# Patient Record
Sex: Female | Born: 1995 | Race: White | Hispanic: No | Marital: Single | Smoking: Current every day smoker
Health system: Southern US, Community
[De-identification: ages and names within clinical notes are randomized; demographics above are authoritative.]

## PROBLEM LIST (undated history)

## (undated) DIAGNOSIS — R011 Cardiac murmur, unspecified: Secondary | ICD-10-CM

## (undated) DIAGNOSIS — Q240 Dextrocardia: Secondary | ICD-10-CM

## (undated) HISTORY — PX: UNILATERAL SALPINGECTOMY: SHX6160

## (undated) HISTORY — PX: ECTOPIC PREGNANCY SURGERY: SHX613

---
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
## 2018-04-17 ENCOUNTER — Emergency Department
Admission: EM | Admit: 2018-04-17 | Discharge: 2018-04-17 | Disposition: A | Discharge status: Home / Self Care | Payer: Self-pay | Attending: Emergency Medicine | Admitting: Emergency Medicine

## 2018-04-17 ENCOUNTER — Other Ambulatory Visit: Payer: Self-pay

## 2018-04-17 ENCOUNTER — Encounter: Payer: Self-pay | Admitting: Emergency Medicine

## 2018-04-17 DIAGNOSIS — J111 Influenza due to unidentified influenza virus with other respiratory manifestations: Secondary | ICD-10-CM | POA: Insufficient documentation

## 2018-04-17 DIAGNOSIS — F17210 Nicotine dependence, cigarettes, uncomplicated: Secondary | ICD-10-CM | POA: Insufficient documentation

## 2018-04-17 DIAGNOSIS — J029 Acute pharyngitis, unspecified: Secondary | ICD-10-CM

## 2018-04-17 DIAGNOSIS — R69 Illness, unspecified: Secondary | ICD-10-CM

## 2018-04-17 LAB — GROUP A STREP BY PCR: Group A Strep by PCR: NOT DETECTED

## 2018-04-17 MED ORDER — PSEUDOEPH-BROMPHEN-DM 30-2-10 MG/5ML PO SYRP: 5.0000 mL | ORAL_SOLUTION | Freq: Four times a day (QID) | ORAL | 0 refills | Status: DC | PRN

## 2018-04-17 MED ORDER — LIDOCAINE VISCOUS HCL 2 % MT SOLN: 10.0000 mL | OROMUCOSAL | 0 refills | Status: DC | PRN

## 2018-04-17 MED ORDER — LIDOCAINE VISCOUS HCL 2 % MT SOLN
15.0000 mL | Freq: Once | OROMUCOSAL | Status: AC
  Administered 2018-04-17: 15 mL via OROMUCOSAL
  Filled 2018-04-17: qty 15

## 2018-04-17 NOTE — ED Notes (Signed)
Pt verbalized understanding of discharge instructions. NAD at this time. 

## 2018-04-17 NOTE — ED Triage Notes (Signed)
C?O sore throat x 2 days.  Pain worse when swallowing.  Also c/o sinus congestion.

## 2018-04-17 NOTE — ED Provider Notes (Signed)
[facility] Emergency Department Provider Note  ____________________________________________  Time seen: Approximately 1:19 PM  I have reviewed the triage vital signs and the nursing notes.   HISTORY  Chief Complaint Sore Throat    HPI Madeline Medina is a 23 y.o. female that presents to the emergency department for evaluation of chills, nasal congestion, sore throat for 3 days.  Patient states that throat is painful with swallowing.  Coworkers have influenza.  No cough, shortness of breath, vomiting.   History reviewed. No pertinent past medical history.  There are no active problems to display for this patient.   Past Surgical History:  Procedure Laterality Date  . ECTOPIC PREGNANCY SURGERY      Prior to Admission medications   Medication Sig Start Date End Date Taking? Authorizing Provider  brompheniramine-pseudoephedrine-DM 30-2-10 MG/5ML syrup Take 5 mLs by mouth 4 (four) times daily as needed. 04/17/18   Enid Derry, PA-C  lidocaine (XYLOCAINE) 2 % solution Use as directed 10 mLs in the mouth or throat as needed. 04/17/18   Enid Derry, PA-C    Allergies Penicillins  No family history on file.  Social History Social History   Tobacco Use  . Smoking status: Current Every Day Smoker    Packs/day: 0.50    Types: Cigarettes  . Smokeless tobacco: Never Used  Substance Use Topics  . Alcohol use: No  . Drug use: No     Review of Systems  Constitutional: Positive for chills. Eyes: No visual changes. No discharge. ENT: Positive for congestion and rhinorrhea. Positive for sore throat. Cardiovascular: No chest pain. Respiratory: Negative for cough. No SOB. Gastrointestinal: No abdominal pain.  No nausea, no vomiting.  No diarrhea.  No constipation. Musculoskeletal: Negative for musculoskeletal pain. Skin: Negative for rash, abrasions, lacerations, ecchymosis. Neurological: Negative for  headaches.   ____________________________________________   PHYSICAL EXAM:  VITAL SIGNS: ED Triage Vitals  Enc Vitals Group     BP 04/17/18 1129 134/76     Pulse Rate 04/17/18 1129 73     Resp 04/17/18 1129 18     Temp 04/17/18 1129 98.2 F (36.8 C)     Temp Source 04/17/18 1129 Oral     SpO2 04/17/18 1129 98 %     Weight 04/17/18 1150 280 lb (127 kg)     Height 04/17/18 1150 5\' 10"  (1.778 m)     Head Circumference --      Peak Flow --      Pain Score 04/17/18 1149 8     Pain Loc --      Pain Edu? --      Excl. in GC? --      Constitutional: Alert and oriented. Well appearing and in no acute distress. Eyes: Conjunctivae are normal. PERRL. EOMI. No discharge. Head: Atraumatic. ENT: No frontal and maxillary sinus tenderness.      Ears: Tympanic membranes pearly gray with good landmarks. No discharge.      Nose: Mild congestion/rhinnorhea.      Mouth/Throat: Mucous membranes are moist. Oropharynx erythematous. Tonsils not enlarged. No exudates. Uvula midline. Neck: No stridor.   Hematological/Lymphatic/Immunilogical: No cervical lymphadenopathy. Cardiovascular: Normal rate, regular rhythm.  Good peripheral circulation. Respiratory: Normal respiratory effort without tachypnea or retractions. Lungs CTAB. Good air entry to the bases with no decreased or absent breath sounds. Gastrointestinal: Bowel sounds 4 quadrants. Soft and nontender to palpation. No guarding or rigidity. No palpable masses. No distention. Musculoskeletal: Medina range of motion to all extremities. No gross deformities  appreciated. Neurologic:  Normal speech and language. No gross focal neurologic deficits are appreciated.  Skin:  Skin is warm, dry and intact. No rash noted. Psychiatric: Mood and affect are normal. Speech and behavior are normal. Patient exhibits appropriate insight and judgement.   ____________________________________________   LABS (all labs ordered are listed, but only abnormal  results are displayed)  Labs Reviewed  GROUP A STREP BY PCR   ____________________________________________  EKG   ____________________________________________  RADIOLOGY  No results found.  ____________________________________________    PROCEDURES  Procedure(s) performed:    Procedures    Medications  lidocaine (XYLOCAINE) 2 % viscous mouth solution 15 mL (15 mLs Mouth/Throat Given 04/17/18 1341)     ____________________________________________   INITIAL IMPRESSION / ASSESSMENT AND PLAN / ED COURSE  Pertinent labs & imaging results that were available during my care of the patient were reviewed by me and considered in my medical decision making (see chart for details).  Review of the [place] CSRS was performed in accordance of the [facility] prior to dispensing any controlled drugs.   Patient's diagnosis is consistent with viral pharyngitis. Vital signs and exam are reassuring.  Strep throat test is negative. Patient has been exposed to influenza but is outside of the window to begin tamiflu. Patient appears well and is staying well hydrated. Patient should alternate tylenol and ibuprofen for fever. Patient feels comfortable going home. Patient will be discharged home with prescriptions for viscous lidocaine, Bromfed. Patient is to follow up with primary care as needed or otherwise directed. Patient is given ED precautions to return to the ED for any worsening or new symptoms    ____________________________________________  FINAL CLINICAL IMPRESSION(S) / ED DIAGNOSES  Final diagnoses:  Viral pharyngitis  Influenza-like illness      NEW MEDICATIONS STARTED DURING THIS VISIT:  ED Discharge Orders         Ordered    lidocaine (XYLOCAINE) 2 % solution  As needed     04/17/18 1327    brompheniramine-pseudoephedrine-DM 30-2-10 MG/5ML syrup  4 times daily PRN     04/17/18 1327              This chart was dictated using voice recognition software/Dragon.  Despite best efforts to proofread, errors can occur which can change the meaning. Any change was purely unintentional.    Enid Derry, PA-C 04/17/18 1520    Arnaldo Natal, MD 04/17/18 1721

## 2018-06-07 ENCOUNTER — Other Ambulatory Visit: Payer: Self-pay

## 2018-06-07 ENCOUNTER — Encounter: Payer: Self-pay | Admitting: Radiology

## 2018-06-07 ENCOUNTER — Emergency Department: Payer: Self-pay

## 2018-06-07 ENCOUNTER — Emergency Department
Admission: EM | Admit: 2018-06-07 | Discharge: 2018-06-07 | Disposition: A | Discharge status: Home / Self Care | Payer: Self-pay | Attending: Emergency Medicine | Admitting: Emergency Medicine

## 2018-06-07 DIAGNOSIS — F17210 Nicotine dependence, cigarettes, uncomplicated: Secondary | ICD-10-CM | POA: Insufficient documentation

## 2018-06-07 DIAGNOSIS — N3000 Acute cystitis without hematuria: Secondary | ICD-10-CM | POA: Insufficient documentation

## 2018-06-07 DIAGNOSIS — A749 Chlamydial infection, unspecified: Secondary | ICD-10-CM | POA: Insufficient documentation

## 2018-06-07 DIAGNOSIS — J039 Acute tonsillitis, unspecified: Secondary | ICD-10-CM | POA: Insufficient documentation

## 2018-06-07 DIAGNOSIS — J029 Acute pharyngitis, unspecified: Secondary | ICD-10-CM

## 2018-06-07 DIAGNOSIS — N760 Acute vaginitis: Secondary | ICD-10-CM | POA: Insufficient documentation

## 2018-06-07 DIAGNOSIS — B9689 Other specified bacterial agents as the cause of diseases classified elsewhere: Secondary | ICD-10-CM | POA: Insufficient documentation

## 2018-06-07 LAB — COMPREHENSIVE METABOLIC PANEL
ALT: 13 U/L (ref 0–44)
AST: 15 U/L (ref 15–41)
Albumin: 3.7 g/dL (ref 3.5–5.0)
Alkaline Phosphatase: 39 U/L (ref 38–126)
Anion gap: 10 (ref 5–15)
BUN: 9 mg/dL (ref 6–20)
CO2: 25 mmol/L (ref 22–32)
Calcium: 9 mg/dL (ref 8.9–10.3)
Chloride: 102 mmol/L (ref 98–111)
Creatinine, Ser: 0.64 mg/dL (ref 0.44–1.00)
GFR calc Af Amer: >60 mL/min (ref >60)
GFR calc non Af Amer: >60 mL/min (ref >60)
Glucose, Bld: 96 mg/dL (ref 70–99)
Potassium: 3.6 mmol/L (ref 3.5–5.1)
Sodium: 137 mmol/L (ref 135–145)
Total Bilirubin: 0.8 mg/dL (ref 0.3–1.2)
Total Protein: 8 g/dL (ref 6.5–8.1)

## 2018-06-07 LAB — URINALYSIS, ROUTINE W REFLEX MICROSCOPIC
Bilirubin Urine: NEGATIVE
Glucose, UA: NEGATIVE mg/dL
Ketones, ur: NEGATIVE mg/dL
Nitrite: POSITIVE — AB
Protein, ur: 100 mg/dL — AB
RBC / HPF: >50 RBC/hpf — ABNORMAL HIGH (ref 0–5)
Specific Gravity, Urine: 1.016 (ref 1.005–1.030)
WBC, UA: >50 WBC/hpf — ABNORMAL HIGH (ref 0–5)
pH: 7 (ref 5.0–8.0)

## 2018-06-07 LAB — WET PREP, GENITAL
Clue Cells Wet Prep HPF POC: POSITIVE — AB
Sperm: NONE SEEN
Trich, Wet Prep: NONE SEEN
Yeast Wet Prep HPF POC: NONE SEEN

## 2018-06-07 LAB — CBC WITH DIFFERENTIAL/PLATELET
Abs Immature Granulocytes: 0.03 10*3/uL (ref 0.00–0.07)
Basophils Absolute: 0 10*3/uL (ref 0.0–0.1)
Basophils Relative: 0 %
Eosinophils Absolute: 0.1 10*3/uL (ref 0.0–0.5)
Eosinophils Relative: 1 %
HCT: 45.9 % (ref 36.0–46.0)
Hemoglobin: 14.7 g/dL (ref 12.0–15.0)
Immature Granulocytes: 0 %
Lymphocytes Relative: 12 %
Lymphs Abs: 1.2 10*3/uL (ref 0.7–4.0)
MCH: 27.8 pg (ref 26.0–34.0)
MCHC: 32 g/dL (ref 30.0–36.0)
MCV: 86.9 fL (ref 80.0–100.0)
Monocytes Absolute: 0.7 10*3/uL (ref 0.1–1.0)
Monocytes Relative: 7 %
Neutro Abs: 7.6 10*3/uL (ref 1.7–7.7)
Neutrophils Relative %: 80 %
Platelets: 309 10*3/uL (ref 150–400)
RBC: 5.28 MIL/uL — ABNORMAL HIGH (ref 3.87–5.11)
RDW: 13.1 % (ref 11.5–15.5)
WBC: 9.6 10*3/uL (ref 4.0–10.5)
nRBC: 0 % (ref 0.0–0.2)

## 2018-06-07 LAB — POCT PREGNANCY, URINE: Preg Test, Ur: NEGATIVE

## 2018-06-07 LAB — CHLAMYDIA/NGC RT PCR (ARMC ONLY)??????????
Chlamydia Tr: DETECTED — AB
N gonorrhoeae: NOT DETECTED

## 2018-06-07 LAB — GROUP A STREP BY PCR: Group A Strep by PCR: NOT DETECTED

## 2018-06-07 LAB — LACTIC ACID, PLASMA: Lactic Acid, Venous: 1.1 mmol/L (ref 0.5–1.9)

## 2018-06-07 MED ORDER — CEPHALEXIN 500 MG PO CAPS: 500.0000 mg | ORAL_CAPSULE | Freq: Four times a day (QID) | ORAL | 0 refills | Status: DC

## 2018-06-07 MED ORDER — LIDOCAINE HCL (PF) 1 % IJ SOLN
0.9000 mL | Freq: Once | INTRAMUSCULAR | Status: AC
  Administered 2018-06-07: 0.9 mL

## 2018-06-07 MED ORDER — PREDNISONE 50 MG PO TABS: 50.0000 mg | ORAL_TABLET | Freq: Every day | ORAL | 0 refills | Status: DC

## 2018-06-07 MED ORDER — MAGIC MOUTHWASH W/LIDOCAINE: 5.0000 mL | Freq: Four times a day (QID) | ORAL | 0 refills | Status: DC

## 2018-06-07 MED ORDER — AZITHROMYCIN 500 MG PO TABS
1000.0000 mg | ORAL_TABLET | Freq: Once | ORAL | Status: AC
  Administered 2018-06-07: 23:00:00 1000 mg via ORAL
  Filled 2018-06-07: qty 2

## 2018-06-07 MED ORDER — SODIUM CHLORIDE 0.9 % IV BOLUS
1000.0000 mL | Freq: Once | INTRAVENOUS | Status: AC
  Administered 2018-06-07: 1000 mL via INTRAVENOUS

## 2018-06-07 MED ORDER — DEXAMETHASONE SODIUM PHOSPHATE 10 MG/ML IJ SOLN
10.0000 mg | Freq: Once | INTRAMUSCULAR | Status: AC
  Administered 2018-06-07: 10 mg via INTRAVENOUS
  Filled 2018-06-07: qty 1

## 2018-06-07 MED ORDER — SULFAMETHOXAZOLE-TRIMETHOPRIM 800-160 MG PO TABS
1.0000 | ORAL_TABLET | Freq: Once | ORAL | Status: AC
  Administered 2018-06-07: 1 via ORAL
  Filled 2018-06-07: qty 1

## 2018-06-07 MED ORDER — FLUCONAZOLE 150 MG PO TABS: 150.0000 mg | ORAL_TABLET | Freq: Once | ORAL | 0 refills | Status: AC

## 2018-06-07 MED ORDER — IOHEXOL 300 MG/ML  SOLN
75.0000 mL | Freq: Once | INTRAMUSCULAR | Status: AC | PRN
  Administered 2018-06-07: 22:00:00 75 mL via INTRAVENOUS
  Filled 2018-06-07: qty 75

## 2018-06-07 MED ORDER — METRONIDAZOLE 500 MG PO TABS
500.0000 mg | ORAL_TABLET | Freq: Once | ORAL | Status: AC
Start: 2018-06-07 — End: 2018-06-07
  Administered 2018-06-07: 22:00:00 500 mg via ORAL
  Filled 2018-06-07: qty 1

## 2018-06-07 MED ORDER — MELOXICAM 15 MG PO TABS: 15.0000 mg | ORAL_TABLET | Freq: Every day | ORAL | 0 refills | Status: DC

## 2018-06-07 MED ORDER — METRONIDAZOLE 500 MG PO TABS: 500.0000 mg | ORAL_TABLET | Freq: Two times a day (BID) | ORAL | 0 refills | Status: DC

## 2018-06-07 MED ORDER — CEFTRIAXONE SODIUM 250 MG IJ SOLR
250.0000 mg | Freq: Once | INTRAMUSCULAR | Status: AC
  Administered 2018-06-07: 23:00:00 250 mg via INTRAMUSCULAR
  Filled 2018-06-07: qty 250

## 2018-06-07 NOTE — ED Notes (Signed)
Presents with swollen lymph nodes on the right side. Sore throat for a month or two. Productive cough.

## 2018-06-07 NOTE — ED Triage Notes (Signed)
Patient reports sore throat and swollen lymph node.  Also reports dysuria.

## 2018-06-07 NOTE — ED Provider Notes (Signed)
[facility] Emergency Department Provider Note  ____________________________________________  Time seen: Approximately 8:26 PM  I have reviewed the triage vital signs and the nursing notes.   HISTORY  Chief Complaint Sore Throat and Dysuria    HPI Madeline Medina is a 23 y.o. female who presents the emergency department complaining of multiple complaints.  Patient presents complaining of right-sided sore throat and enlarging right-sided neck mass.  Patient reports that she has had a sore throat x1 month.  It waxes and wanes in its intensity.  Patient reports that she is also developing an enlarging mass in the right submandibular/right anterior neck region.  Patient has had intermittent mild nasal congestion and intermittent cough.  She denies any fevers or chills, ear pain, shortness of breath or difficulty breathing.  Cough is nonproductive and states that it is "from my nasal congestion."   Patient is also complaining of dysuria with suprapubic pain.  Patient reports that she is having burning with urination that has now developed to a burning sensation constantly.  She denies any flank pain.  No fevers or chills.  She does have a history of UTIs and states that this started similarly.  Patient's last period was approximately 3 weeks ago but states that she is "spotting" at this time.  Patient denies any vaginal discharge.  She does request testing for STD.        History reviewed. No pertinent past medical history.  There are no active problems to display for this patient.   Past Surgical History:  Procedure Laterality Date  . ECTOPIC PREGNANCY SURGERY    . UNILATERAL SALPINGECTOMY Right     Prior to Admission medications   Medication Sig Start Date End Date Taking? Authorizing Provider  brompheniramine-pseudoephedrine-DM 30-2-10 MG/5ML syrup Take 5 mLs by mouth 4 (four) times daily as needed. 04/17/18   Enid Derry, PA-C  cephALEXin (KEFLEX) 500 MG  capsule Take 1 capsule (500 mg total) by mouth 4 (four) times daily. 06/07/18   Cuthriell, Delorise Royals, PA-C  fluconazole (DIFLUCAN) 150 MG tablet Take 1 tablet (150 mg total) by mouth once for 1 dose. Take after finishing antibiotics 06/07/18 06/07/18  Cuthriell, Delorise Royals, PA-C  lidocaine (XYLOCAINE) 2 % solution Use as directed 10 mLs in the mouth or throat as needed. 04/17/18   Enid Derry, PA-C  magic mouthwash w/lidocaine SOLN Take 5 mLs by mouth 4 (four) times daily. 06/07/18   Cuthriell, Delorise Royals, PA-C  meloxicam (MOBIC) 15 MG tablet Take 1 tablet (15 mg total) by mouth daily. 06/07/18   Cuthriell, Delorise Royals, PA-C  metroNIDAZOLE (FLAGYL) 500 MG tablet Take 1 tablet (500 mg total) by mouth 2 (two) times daily. 06/07/18   Cuthriell, Delorise Royals, PA-C  predniSONE (DELTASONE) 50 MG tablet Take 1 tablet (50 mg total) by mouth daily with breakfast. 06/07/18   Cuthriell, Delorise Royals, PA-C    Allergies Penicillins  History reviewed. No pertinent family history.  Social History Social History   Tobacco Use  . Smoking status: Current Every Day Smoker    Packs/day: 0.50    Types: Cigarettes  . Smokeless tobacco: Never Used  Substance Use Topics  . Alcohol use: No  . Drug use: No     Review of Systems  Constitutional: No fever/chills Eyes: No visual changes. No discharge ENT: No upper respiratory complaints. Cardiovascular: no chest pain. Respiratory: no cough. No SOB. Gastrointestinal: Positive for suprapubic pain no nausea, no vomiting.  No diarrhea.  No constipation. Genitourinary: Positive for  dysuria. No hematuria.  Positive for vaginal spotting.  No vaginal discharge. Musculoskeletal: Negative for musculoskeletal pain. Skin: Negative for rash, abrasions, lacerations, ecchymosis. Neurological: Negative for headaches, focal weakness or numbness. 10-point ROS otherwise negative.  ____________________________________________   PHYSICAL EXAM:  VITAL SIGNS: ED Triage Vitals   Enc Vitals Group     BP 06/07/18 2020 126/72     Pulse Rate 06/07/18 2020 (!) 112     Resp 06/07/18 2020 18     Temp 06/07/18 2020 98.3 F (36.8 C)     Temp Source 06/07/18 2020 Oral     SpO2 06/07/18 2020 100 %     Weight 06/07/18 2019 270 lb (122.5 kg)     Height 06/07/18 2019  (1.778 m)     Head Circumference --      Peak Flow --      Pain Score 06/07/18 2019 9     Pain Loc --      Pain Edu? --      Excl. in GC? --      Constitutional: Alert and oriented. Well appearing and in no acute distress. Eyes: Conjunctivae are normal. PERRL. EOMI. Head: Atraumatic. ENT:      Ears:       Nose: No congestion/rhinnorhea.      Mouth/Throat: Mucous membranes are moist.  Tonsils are grossly erythematous and edematous.  Tonsil on right is enlarged greater than tonsil on left.  Mild uvular deviation.  No other intraoral erythema or edema appreciated. Neck: No stridor.  Positive for tender lesion noted in the right submandibular region extending down the right anterior cervical chain region.  This is firm, nonmobile.  Palpation in the sub-lingular region reveals no firmness or tenderness. Hematological/Lymphatic/Immunilogical: No gross appreciable cervical lymphadenopathy.  Patient does have firm lesion as noted above in the anterior cervical chain on the right side. Cardiovascular: Normal rate, regular rhythm. Normal S1 and S2.  Good peripheral circulation. Respiratory: Normal respiratory effort without tachypnea or retractions. Lungs CTAB. Good air entry to the bases with no decreased or absent breath sounds. Gastrointestinal: Bowel sounds 4 quadrants. Soft and nontender to palpation. No guarding or rigidity. No palpable masses. No distention. No CVA tenderness. Genitourinary: No external lesions or chancres noted.  No external discharge.  Using speculum, no vaginal tears or lesions identified.  Mild bloody drainage noted in the vaginal vault.  No other gross drainage/discharge noted.   Cervix is not friable.  Bimanual exam reveals very mild cervical motion tenderness.  No lesions or palpable masses in the bilateral adnexa. Musculoskeletal: Full range of motion to all extremities. No gross deformities appreciated. Neurologic:  Normal speech and language. No gross focal neurologic deficits are appreciated.  Skin:  Skin is warm, dry and intact. No rash noted. Psychiatric: Mood and affect are normal. Speech and behavior are normal. Patient exhibits appropriate insight and judgement.  Pelvic exam performed with female RN chaperone ____________________________________________   LABS (all labs ordered are listed, but only abnormal results are displayed)  Labs Reviewed  WET PREP, GENITAL - Abnormal; Notable for the following components:      Result Value   Clue Cells Wet Prep HPF POC POSITIVE (*)    WBC, Wet Prep HPF POC 6-10 (*)    All other components within normal limits  CHLAMYDIA/NGC RT PCR ([facility] ONLY) - Abnormal; Notable for the following components:   Chlamydia Tr DETECTED (*)    All other components within normal limits  URINALYSIS, ROUTINE W REFLEX MICROSCOPIC -  Abnormal; Notable for the following components:   Color, Urine AMBER (*)    APPearance CLOUDY (*)    Hgb urine dipstick LARGE (*)    Protein, ur 100 (*)    Nitrite POSITIVE (*)    Leukocytes,Ua MODERATE (*)    RBC / HPF >50 (*)    WBC, UA >50 (*)    Bacteria, UA FEW (*)    Non Squamous Epithelial PRESENT (*)    All other components within normal limits  CBC WITH DIFFERENTIAL/PLATELET - Abnormal; Notable for the following components:   RBC 5.28 (*)    All other components within normal limits  GROUP A STREP BY PCR  CULTURE, BLOOD (ROUTINE X 2)  COMPREHENSIVE METABOLIC PANEL  LACTIC ACID, PLASMA  POC URINE PREG, ED  POCT PREGNANCY, URINE   ____________________________________________  EKG   ____________________________________________  RADIOLOGY I personally viewed and evaluated  these images as part of my medical decision making, as well as reviewing the written report by the radiologist.  Ct Soft Tissue Neck W Contrast  Result Date: 06/07/2018 CLINICAL DATA:  Right-sided throat pain EXAM: CT NECK WITH CONTRAST TECHNIQUE: Multidetector CT imaging of the neck was performed using the standard protocol following the bolus administration of intravenous contrast. CONTRAST:  75mL OMNIPAQUE IOHEXOL 300 MG/ML  SOLN COMPARISON:  None. FINDINGS: Pharynx and larynx: Right palatine tonsil is enlarged. There are multiple small low-attenuation foci within the tonsil but no discrete peritonsillar abscess or collection. No retropharyngeal abscess effusion. The adenoid tonsils normal. Left palatine tonsil is mildly enlarged. Salivary glands: No inflammation, mass, or stone. Thyroid: Normal Lymph nodes: Right level 2A lymph nodes measure up to 1.9 cm. No enlarged or abnormal density left cervical nodes. Vascular: Normal Limited intracranial: Negative. Visualized orbits: Negative. Mastoids and visualized paranasal sinuses: Clear. Skeleton: No acute or aggressive process. Upper chest: Negative. Other: None. IMPRESSION: 1. Enlarged palatine tonsils, right much worse than left, consistent with acute tonsillopharyngitis. No peritonsillar or retropharyngeal abscess. 2. Reactive right cervical lymphadenopathy. Electronically Signed   By: Deatra RobinsonKevin  Herman M.D.   On: 06/07/2018 21:59    ____________________________________________    PROCEDURES  Procedure(s) performed:    Procedures    Medications  cefTRIAXone (ROCEPHIN) injection 250 mg (has no administration in time range)  azithromycin (ZITHROMAX) tablet 1,000 mg (has no administration in time range)  sodium chloride 0.9 % bolus 1,000 mL (0 mLs Intravenous Stopped 06/07/18 2228)  iohexol (OMNIPAQUE) 300 MG/ML solution 75 mL (75 mLs Intravenous Contrast Given 06/07/18 2139)  dexamethasone (DECADRON) injection 10 mg (10 mg Intravenous Given  06/07/18 2228)  sulfamethoxazole-trimethoprim (BACTRIM DS) 800-160 MG per tablet 1 tablet (1 tablet Oral Given 06/07/18 2228)  metroNIDAZOLE (FLAGYL) tablet 500 mg (500 mg Oral Given 06/07/18 2228)     ____________________________________________   INITIAL IMPRESSION / ASSESSMENT AND PLAN / ED COURSE  Pertinent labs & imaging results that were available during my care of the patient were reviewed by me and considered in my medical decision making (see chart for details).  Review of the [place] CSRS was performed in accordance of the [facility] prior to dispensing any controlled drugs.        The patient was evaluated for the symptoms described in the history of present illness. The patient was evaluated in the context of the global COVID-19 pandemic, which necessitated consideration that the patient might be at risk for infection with the SARS-CoV-2 virus that causes COVID-19. Institutional protocols and algorithms that pertain to the evaluation of patients at  risk for COVID-19 are in a state of rapid change based on information released by regulatory bodies including the CDC and federal and state organizations. The most current policies and algorithms were followed during the patient's care in the ED.    No recent high-risk travel or sick contacts that would suggest an increased risk of COVID-19.  This patient does not currently meet CDC criteria for testing and I explained that in detail.  The work-up/exam today is reassuring with no evidence of emergent medical condition that requires further work-up, evaluation, or inpatient treatment other than what has been ordered/performed. I gave my usual and customary follow-up recommendations and return precautions and the patient understands and agrees with the plan.    Patient's diagnosis is consistent with tonsillopharyngitis, acute UTI, BV and chlamydia.  Patient presents emergency department multiple complaints.  Given tonsillar edema, uvular shift, palpable  lesion to the submandibular region, patient had labs, CT scan of this region.  No evidence of peritonsillar retropharyngeal abscess.  No indication of Lemierre's or Ludwick's angina.  As this appears to be several weeks in duration, I have a low suspicion for viral or bacterial etiology.  There is no indication from patient's history that this is likely Epstein-Barr/mononucleosis.  Patient has had chronic issues with her tonsils and states that at this time she believes she will need to see ENT for tonsillectomy.  Patient will be placed on Magic mouthwash, prednisone for tonsillitis.  Patient did have a positive urinary tract infection based off a urinalysis as well as indication of BV and chlamydia.  Patient is treated in the emergency department with Rocephin and azithromycin for chlamydia.  Patient does have a penicillin allergy but states that she is received Rocephin with no difficulty.  Patient will be treated with Flagyl and Keflex for UTI and BV.  I have prescribed 1 dose of Diflucan after antibiotics are finished for any possible candidal infection.  Patient is to follow-up with ENT as well as OB/GYN.  Return precautions are discussed with the patient..  Patient is given ED precautions to return to the ED for any worsening or new symptoms.     ____________________________________________  FINAL CLINICAL IMPRESSION(S) / ED DIAGNOSES  Final diagnoses:  Tonsillopharyngitis  Acute cystitis without hematuria  BV (bacterial vaginosis)  Chlamydia      NEW MEDICATIONS STARTED DURING THIS VISIT:  ED Discharge Orders         Ordered    cephALEXin (KEFLEX) 500 MG capsule  4 times daily     06/07/18 2321    metroNIDAZOLE (FLAGYL) 500 MG tablet  2 times daily     06/07/18 2321    predniSONE (DELTASONE) 50 MG tablet  Daily with breakfast     06/07/18 2321    meloxicam (MOBIC) 15 MG tablet  Daily     06/07/18 2321    magic mouthwash w/lidocaine SOLN  4 times daily    Note to Pharmacy:   Dispense in a 1/1/1 ratio. Use lidocaine, diphenhydramine, prednisolone   06/07/18 2321    fluconazole (DIFLUCAN) 150 MG tablet   Once     06/07/18 2321              This chart was dictated using voice recognition software/Dragon. Despite best efforts to proofread, errors can occur which can change the meaning. Any change was purely unintentional.    Racheal Patches, PA-C 06/07/18 2325    Emily Filbert, MD 06/07/18 2329

## 2018-06-07 NOTE — ED Notes (Signed)
Pt returned to room att from CT, no needs expressed, will continue to monitor

## 2018-06-07 NOTE — ED Notes (Addendum)
ED Provider at bedside.  Pt c/o of sore throat with swelling to neck since last visit to ED (04/17/18), denies difficulty breathing, pain on palpation and swallowing to right side lower jaw  And dysuria with bleeding for the last 4-5 days, pt denies recent LMP (approx 3 weeks ago), reports taking "Azo" without positive results

## 2018-06-07 NOTE — ED Notes (Signed)
Blanket given 

## 2018-06-08 MED ORDER — LIDOCAINE HCL (PF) 1 % IJ SOLN
INTRAMUSCULAR | Status: AC
  Administered 2018-06-07: 0.9 mL
  Filled 2018-06-08: qty 5

## 2018-06-08 NOTE — ED Notes (Signed)
Discharge instructions reviewed with patient. Questions fielded by this RN. Patient verbalizes understanding of instructions. Patient discharged home in stable condition per [place], [place]. No acute distress noted at time of discharge.   Peripheral IV discontinued. Catheter intact. No signs of infiltration or redness. Gauze applied to IV site.

## 2018-06-12 LAB — CULTURE, BLOOD (ROUTINE X 2): Culture: NO GROWTH

## 2018-12-04 ENCOUNTER — Other Ambulatory Visit: Payer: Self-pay

## 2018-12-04 DIAGNOSIS — Z20822 Contact with and (suspected) exposure to covid-19: Secondary | ICD-10-CM

## 2018-12-06 LAB — NOVEL CORONAVIRUS, NAA: SARS-CoV-2, NAA: NOT DETECTED

## 2018-12-07 ENCOUNTER — Telehealth: Payer: Self-pay | Admitting: General Practice

## 2018-12-07 NOTE — Telephone Encounter (Signed)
Negative COVID results given. Patient results "NOT Detected." Caller expressed understanding. ° °

## 2019-04-26 ENCOUNTER — Ambulatory Visit: Payer: Self-pay

## 2019-07-28 ENCOUNTER — Emergency Department
Admission: EM | Admit: 2019-07-28 | Discharge: 2019-07-29 | Disposition: A | Discharge status: Home / Self Care | Payer: Medicaid - Out of State | Attending: Emergency Medicine | Admitting: Emergency Medicine

## 2019-07-28 ENCOUNTER — Other Ambulatory Visit: Payer: Self-pay

## 2019-07-28 ENCOUNTER — Emergency Department: Payer: Medicaid - Out of State

## 2019-07-28 DIAGNOSIS — R112 Nausea with vomiting, unspecified: Secondary | ICD-10-CM | POA: Insufficient documentation

## 2019-07-28 DIAGNOSIS — R197 Diarrhea, unspecified: Secondary | ICD-10-CM | POA: Insufficient documentation

## 2019-07-28 DIAGNOSIS — Z79899 Other long term (current) drug therapy: Secondary | ICD-10-CM | POA: Insufficient documentation

## 2019-07-28 DIAGNOSIS — R1011 Right upper quadrant pain: Secondary | ICD-10-CM | POA: Insufficient documentation

## 2019-07-28 DIAGNOSIS — F17210 Nicotine dependence, cigarettes, uncomplicated: Secondary | ICD-10-CM | POA: Diagnosis not present

## 2019-07-28 DIAGNOSIS — R1013 Epigastric pain: Secondary | ICD-10-CM | POA: Diagnosis not present

## 2019-07-28 DIAGNOSIS — R109 Unspecified abdominal pain: Secondary | ICD-10-CM

## 2019-07-28 LAB — URINALYSIS, COMPLETE (UACMP) WITH MICROSCOPIC
Bilirubin Urine: NEGATIVE
Glucose, UA: NEGATIVE mg/dL
Hgb urine dipstick: NEGATIVE
Ketones, ur: NEGATIVE mg/dL
Nitrite: NEGATIVE
Protein, ur: NEGATIVE mg/dL
Specific Gravity, Urine: 1.025 (ref 1.005–1.030)
pH: 6 (ref 5.0–8.0)

## 2019-07-28 LAB — COMPREHENSIVE METABOLIC PANEL
ALT: 18 U/L (ref 0–44)
AST: 18 U/L (ref 15–41)
Albumin: 3.6 g/dL (ref 3.5–5.0)
Alkaline Phosphatase: 32 U/L — ABNORMAL LOW (ref 38–126)
Anion gap: 8 (ref 5–15)
BUN: 13 mg/dL (ref 6–20)
CO2: 26 mmol/L (ref 22–32)
Calcium: 8.6 mg/dL — ABNORMAL LOW (ref 8.9–10.3)
Chloride: 104 mmol/L (ref 98–111)
Creatinine, Ser: 0.82 mg/dL (ref 0.44–1.00)
GFR calc Af Amer: >60 mL/min (ref >60)
GFR calc non Af Amer: >60 mL/min (ref >60)
Glucose, Bld: 91 mg/dL (ref 70–99)
Potassium: 3.8 mmol/L (ref 3.5–5.1)
Sodium: 138 mmol/L (ref 135–145)
Total Bilirubin: 1 mg/dL (ref 0.3–1.2)
Total Protein: 7.1 g/dL (ref 6.5–8.1)

## 2019-07-28 LAB — CBC
HCT: 43.6 % (ref 36.0–46.0)
Hemoglobin: 14.8 g/dL (ref 12.0–15.0)
MCH: 31.2 pg (ref 26.0–34.0)
MCHC: 33.9 g/dL (ref 30.0–36.0)
MCV: 92 fL (ref 80.0–100.0)
Platelets: 307 10*3/uL (ref 150–400)
RBC: 4.74 MIL/uL (ref 3.87–5.11)
RDW: 12 % (ref 11.5–15.5)
WBC: 8 10*3/uL (ref 4.0–10.5)
nRBC: 0 % (ref 0.0–0.2)

## 2019-07-28 LAB — LIPASE, BLOOD: Lipase: 24 U/L (ref 11–51)

## 2019-07-28 LAB — POCT PREGNANCY, URINE: Preg Test, Ur: NEGATIVE

## 2019-07-28 MED ORDER — PROMETHAZINE HCL 25 MG/ML IJ SOLN
25.0000 mg | Freq: Once | INTRAMUSCULAR | Status: AC
  Administered 2019-07-28: 25 mg via INTRAVENOUS
  Filled 2019-07-28: qty 1

## 2019-07-28 MED ORDER — DICYCLOMINE HCL 10 MG PO CAPS: 10.0000 mg | ORAL_CAPSULE | Freq: Four times a day (QID) | ORAL | 0 refills | Status: DC

## 2019-07-28 MED ORDER — TRAMADOL HCL 50 MG PO TABS: 50.0000 mg | ORAL_TABLET | Freq: Four times a day (QID) | ORAL | 0 refills | Status: AC | PRN

## 2019-07-28 MED ORDER — SODIUM CHLORIDE 0.9 % IV BOLUS
500.0000 mL | Freq: Once | INTRAVENOUS | Status: AC
  Administered 2019-07-28: 500 mL via INTRAVENOUS

## 2019-07-28 MED ORDER — DICYCLOMINE HCL 10 MG/ML IM SOLN
20.0000 mg | Freq: Once | INTRAMUSCULAR | Status: AC
  Administered 2019-07-28: 20 mg via INTRAMUSCULAR
  Filled 2019-07-28: qty 2

## 2019-07-28 MED ORDER — PROMETHAZINE HCL 12.5 MG PO TABS
12.5000 mg | ORAL_TABLET | Freq: Four times a day (QID) | ORAL | 0 refills | Status: DC | PRN
Start: 2019-07-28 — End: 2020-09-22

## 2019-07-28 NOTE — Discharge Instructions (Addendum)
Bentyl and Phenergan can be taken at home. Tramadol can be taken at home for pain. You can make follow-up appointment with GI regarding cholelithiasis.

## 2019-07-28 NOTE — ED Triage Notes (Signed)
Pt to the er for abd pain x 3 days. Pt states she has had upper right abd pain that radiates to the back with nausea and vomiting. No hx of gallbladder disease.

## 2019-07-28 NOTE — ED Provider Notes (Signed)
Emergency Department Provider Note  ____________________________________________  Time seen: Approximately 10:52 PM  I have reviewed the triage vital signs and the nursing notes.   HISTORY  Chief Complaint Abdominal Pain   Historian Patient     HPI Madeline Medina is a 24 y.o. female presents to the emergency department with epigastric and right upper quadrant abdominal pain for the past 4 days.  Patient states that she has had nausea, vomiting and diarrhea.  Nausea and vomiting seem to be worse after eating.  She denies fever or chills at home.  She does state that the pain seems to be episodic.  She denies experiencing similar episodes of pain in the past.  No other alleviating measures have been attempted.   History reviewed. No pertinent past medical history.   Immunizations up to date:  Yes.     History reviewed. No pertinent past medical history.  There are no problems to display for this patient.   Past Surgical History:  Procedure Laterality Date  . ECTOPIC PREGNANCY SURGERY    . UNILATERAL SALPINGECTOMY Right     Prior to Admission medications   Medication Sig Start Date End Date Taking? Authorizing Provider  brompheniramine-pseudoephedrine-DM 30-2-10 MG/5ML syrup Take 5 mLs by mouth 4 (four) times daily as needed. 04/17/18   Enid Derry, PA-C  cephALEXin (KEFLEX) 500 MG capsule Take 1 capsule (500 mg total) by mouth 4 (four) times daily. 06/07/18   Cuthriell, Delorise Royals, PA-C  lidocaine (XYLOCAINE) 2 % solution Use as directed 10 mLs in the mouth or throat as needed. 04/17/18   Enid Derry, PA-C  magic mouthwash w/lidocaine SOLN Take 5 mLs by mouth 4 (four) times daily. 06/07/18   Cuthriell, Delorise Royals, PA-C  meloxicam (MOBIC) 15 MG tablet Take 1 tablet (15 mg total) by mouth daily. 06/07/18   Cuthriell, Delorise Royals, PA-C  metroNIDAZOLE (FLAGYL) 500 MG tablet Take 1 tablet (500 mg total) by mouth 2 (two) times daily. 06/07/18   Cuthriell, Delorise Royals, PA-C   predniSONE (DELTASONE) 50 MG tablet Take 1 tablet (50 mg total) by mouth daily with breakfast. 06/07/18   Cuthriell, Delorise Royals, PA-C    Allergies Hydrocodone and Penicillins  No family history on file.  Social History Social History   Tobacco Use  . Smoking status: Current Every Day Smoker    Packs/day: 0.50    Types: Cigarettes  . Smokeless tobacco: Never Used  Substance Use Topics  . Alcohol use: No  . Drug use: No     Review of Systems  Constitutional: No fever/chills Eyes:  No discharge ENT: No upper respiratory complaints. Respiratory: no cough. No SOB/ use of accessory muscles to breath Gastrointestinal: Patient has nausea and vomiting as well as epigastric and right upper quadrant abdominal pain. Musculoskeletal: Negative for musculoskeletal pain. Skin: Negative for rash, abrasions, lacerations, ecchymosis.    ____________________________________________   PHYSICAL EXAM:  VITAL SIGNS: ED Triage Vitals  Enc Vitals Group     BP 07/28/19 1922 (!) 128/92     Pulse Rate 07/28/19 1922 80     Resp 07/28/19 1922 18     Temp 07/28/19 1922 99.2 F (37.3 C)     Temp Source 07/28/19 1922 Oral     SpO2 07/28/19 1922 100 %     Weight 07/28/19 1923 240 lb (108.9 kg)     Height 07/28/19 1923 5\' 10"  (1.778 m)     Head Circumference --      Peak Flow --  Pain Score 07/28/19 1923 6     Pain Loc --      Pain Edu? --      Excl. in GC? --      Constitutional: Alert and oriented. Well appearing and in no acute distress. Eyes: Conjunctivae are normal. PERRL. EOMI. Head: Atraumatic. ENT:      Nose: No congestion/rhinnorhea.      Mouth/Throat: Mucous membranes are moist.  Neck: No stridor.  No cervical spine tenderness to palpation. Cardiovascular: Normal rate, regular rhythm. Normal S1 and S2.  Good peripheral circulation. Respiratory: Normal respiratory effort without tachypnea or retractions. Lungs CTAB. Good air entry to the bases with no decreased or absent  breath sounds Gastrointestinal: Bowel sounds x 4 quadrants.  Patient has tenderness without guarding in the right upper quadrant and epigastric abdomen.  No distention. Musculoskeletal: Full range of motion to all extremities. No obvious deformities noted Neurologic:  Normal for age. No gross focal neurologic deficits are appreciated.  Skin:  Skin is warm, dry and intact. No rash noted. Psychiatric: Mood and affect are normal for age. Speech and behavior are normal.   ____________________________________________   LABS (all labs ordered are listed, but only abnormal results are displayed)  Labs Reviewed  COMPREHENSIVE METABOLIC PANEL - Abnormal; Notable for the following components:      Result Value   Calcium 8.6 (*)    Alkaline Phosphatase 32 (*)    All other components within normal limits  URINALYSIS, COMPLETE (UACMP) WITH MICROSCOPIC - Abnormal; Notable for the following components:   Color, Urine YELLOW (*)    APPearance CLOUDY (*)    Leukocytes,Ua SMALL (*)    Bacteria, UA RARE (*)    All other components within normal limits  LIPASE, BLOOD  CBC  POC URINE PREG, ED  POCT PREGNANCY, URINE   ____________________________________________  EKG   ____________________________________________  RADIOLOGY Geraldo Pitter, personally viewed and evaluated these images (plain radiographs) as part of my medical decision making, as well as reviewing the written report by the radiologist.  US Abdomen Limited RUQ  Result Date: 07/28/2019 CLINICAL DATA:  Abdominal pain for 3 days. EXAM: ULTRASOUND ABDOMEN LIMITED RIGHT UPPER QUADRANT COMPARISON:  None. FINDINGS: Gallbladder: Physiologically distended. Intraluminal gallstones largest 15 mm. No gallbladder wall thickening or pericholecystic fluid. No sonographic Murphy sign noted by sonographer. Common bile duct: Diameter: 2 mm, normal. Liver: No focal lesion identified. Within normal limits in parenchymal echogenicity. Portal vein is  patent on color Doppler imaging with normal direction of blood flow towards the liver. Other: No right upper quadrant free fluid. IMPRESSION: Gallstones without sonographic findings of acute cholecystitis. No biliary dilatation. Electronically Signed   By: Narda Rutherford M.D.   On: 07/28/2019 20:31    ____________________________________________    PROCEDURES  Procedure(s) performed:     Procedures     Medications  promethazine (PHENERGAN) injection 25 mg (has no administration in time range)  sodium chloride 0.9 % bolus 500 mL (has no administration in time range)  dicyclomine (BENTYL) injection 20 mg (has no administration in time range)     ____________________________________________   INITIAL IMPRESSION / ASSESSMENT AND PLAN / ED COURSE  Pertinent labs & imaging results that were available during my care of the patient were reviewed by me and considered in my medical decision making (see chart for details).      Assessment and Plan:  Abdominal pain 24 year old female presents to the emergency department with right upper quadrant and epigastric abdominal pain  for the past 4 days.  Vital signs were reassuring in triage.  On physical exam, patient had some tenderness to palpation along the right upper quadrant but no guarding.  Differential diagnosis included cholecystitis, pyelonephritis, pregnancy, pancreatitis, constipation...  CBC was reassuring without leukocytosis.  AST and ALT were within reference range.  Urinalysis revealed no evidence of cystitis.  Lipase was within reference range.  Urine pregnancy test was negative.  Right upper quadrant ultrasound indicated cholelithiasis without cholecystitis.  Patient reported that her pain improved with Phenergan, fluids and Bentyl given in the emergency department.  She was discharged with Phenergan, tramadol and Bentyl.  She was advised to follow-up with GI regarding cholelithiasis.  Return precautions were given to  return with new or worsening symptoms.  Two work notes were provided at her request.  ____________________________________________  FINAL CLINICAL IMPRESSION(S) / ED DIAGNOSES  Final diagnoses:  Abdominal pain      NEW MEDICATIONS STARTED DURING THIS VISIT:  ED Discharge Orders    None          This chart was dictated using voice recognition software/Dragon. Despite best efforts to proofread, errors can occur which can change the meaning. Any change was purely unintentional.     Lannie Pinn, PA-C 07/28/19 2330    Vanessa [place], MD 08/01/19 1535

## 2020-09-22 ENCOUNTER — Emergency Department
Admission: EM | Admit: 2020-09-22 | Discharge: 2020-09-22 | Disposition: A | Discharge status: Home / Self Care | Payer: BC Managed Care – PPO | Attending: Emergency Medicine | Admitting: Emergency Medicine

## 2020-09-22 ENCOUNTER — Other Ambulatory Visit: Payer: Self-pay

## 2020-09-22 ENCOUNTER — Encounter: Payer: Self-pay | Admitting: Emergency Medicine

## 2020-09-22 DIAGNOSIS — F17210 Nicotine dependence, cigarettes, uncomplicated: Secondary | ICD-10-CM | POA: Diagnosis not present

## 2020-09-22 DIAGNOSIS — R21 Rash and other nonspecific skin eruption: Secondary | ICD-10-CM | POA: Insufficient documentation

## 2020-09-22 MED ORDER — TRIAMCINOLONE ACETONIDE 0.5 % EX OINT
1.0000 | TOPICAL_OINTMENT | Freq: Two times a day (BID) | CUTANEOUS | 0 refills | Status: DC
Start: 2020-09-22 — End: 2020-10-25

## 2020-09-22 MED ORDER — HYDROXYZINE HCL 50 MG PO TABS
50.0000 mg | ORAL_TABLET | Freq: Three times a day (TID) | ORAL | 0 refills | Status: DC | PRN
Start: 2020-09-22 — End: 2020-10-25

## 2020-09-22 NOTE — ED Notes (Signed)
See triage note  Presents with a rash  States she noticed small single red areas to hands,chest and legs about 1 week ago  Then this am she noticed a larger area to right anterior shoulder area  Positive itching

## 2020-09-22 NOTE — ED Provider Notes (Signed)
[facility] Emergency Department Provider Note   ____________________________________________   Event Date/Time   First MD Initiated Contact with Patient 09/22/20 0809     (approximate)  I have reviewed the triage vital signs and the nursing notes.   HISTORY  Chief Complaint Rash    HPI Madeline Medina is a 25 y.o. female patient presents with intermittent itchy rash to the right upper chest wall.  Patient stated breakout occurred several weeks ago and comes and go.  Mild itching associated with the rash.  Patient states she was diagnosed and currently taking treatment for genital herpes with acyclovir.         No past medical history on file.  There are no problems to display for this patient.   Past Surgical History:  Procedure Laterality Date   ECTOPIC PREGNANCY SURGERY     UNILATERAL SALPINGECTOMY Right     Prior to Admission medications   Medication Sig Start Date End Date Taking? Authorizing Provider  hydrOXYzine (ATARAX/VISTARIL) 50 MG tablet Take 1 tablet (50 mg total) by mouth 3 (three) times daily as needed for itching. 09/22/20  Yes Joni Reining, PA-C  triamcinolone ointment (KENALOG) 0.5 % Apply 1 application topically 2 (two) times daily. 09/22/20  Yes Joni Reining, PA-C  brompheniramine-pseudoephedrine-DM 30-2-10 MG/5ML syrup Take 5 mLs by mouth 4 (four) times daily as needed. 04/17/18   Enid Derry, PA-C  dicyclomine (BENTYL) 10 MG capsule Take 1 capsule (10 mg total) by mouth 4 (four) times daily for 14 days. 07/28/19 08/11/19  Orvil Feil, PA-C  lidocaine (XYLOCAINE) 2 % solution Use as directed 10 mLs in the mouth or throat as needed. 04/17/18   Enid Derry, PA-C    Allergies Hydrocodone and Penicillins  No family history on file.  Social History Social History   Tobacco Use   Smoking status: Every Day    Packs/day: 0.50    Types: Cigarettes   Smokeless tobacco: Never  Vaping Use   Vaping Use: Never used   Substance Use Topics   Alcohol use: No   Drug use: No    Review of Systems  Constitutional: No fever/chills Eyes: No visual changes. ENT: No sore throat. Cardiovascular: Denies chest pain. Respiratory: Denies shortness of breath. Gastrointestinal: No abdominal pain.  No nausea, no vomiting.  No diarrhea.  No constipation. Genitourinary: Negative for dysuria. Musculoskeletal: Negative for back pain. Skin: Positive for rash. Neurological: Negative for headaches, focal weakness or numbness. Allergic/Immunilogical: Hydrocodone and penicillin. ____________________________________________   PHYSICAL EXAM:  VITAL SIGNS: ED Triage Vitals  Enc Vitals Group     BP 09/22/20 0723 118/71     Pulse Rate 09/22/20 0723 92     Resp 09/22/20 0723 20     Temp 09/22/20 0723 98 F (36.7 C)     Temp Source 09/22/20 0723 Oral     SpO2 09/22/20 0723 99 %     Weight 09/22/20 0723 (!) 320 lb (145.2 kg)     Height 09/22/20 0723 5\' 10"  (1.778 m)     Head Circumference --      Peak Flow --      Pain Score 09/22/20 0727 0     Pain Loc --      Pain Edu? --      Excl. in GC? --     Constitutional: Alert and oriented. Well appearing and in no acute distress.  BMI is 45.92. Eyes: Conjunctivae are normal. PERRL. EOMI. Head: Atraumatic. Nose: No congestion/rhinnorhea.  Mouth/Throat: Mucous membranes are moist.  Oropharynx non-erythematous. Neck: No stridor.   Hematological/Lymphatic/Immunilogical: No cervical lymphadenopathy. Cardiovascular: Normal rate, regular rhythm. Grossly normal heart sounds.  Good peripheral circulation. Respiratory: Normal respiratory effort.  No retractions. Lungs CTAB. Gastrointestinal: Soft and nontender. No distention. No abdominal bruits. No CVA tenderness. Genitourinary: Deferred Musculoskeletal: No lower extremity tenderness nor edema.  No joint effusions. Neurologic:  Normal speech and language. No gross focal neurologic deficits are appreciated. No gait  instability. Skin:  Skin is warm, dry and intact.  Erythematous macular lesion superior aspect of right chest wall and right lateral neck area.   Psychiatric: Mood and affect are normal. Speech and behavior are normal.  ____________________________________________   LABS (all labs ordered are listed, but only abnormal results are displayed)  Labs Reviewed - No data to display ____________________________________________  EKG   ____________________________________________  RADIOLOGY I, Joni Reining, personally viewed and evaluated these images (plain radiographs) as part of my medical decision making, as well as reviewing the written report by the radiologist.  ED MD interpretation:    Official radiology report(s): No results found.  ____________________________________________   PROCEDURES  Procedure(s) performed (including Critical Care):  Procedures   ____________________________________________   INITIAL IMPRESSION / ASSESSMENT AND PLAN / ED COURSE  As part of my medical decision making, I reviewed the following data within the electronic MEDICAL RECORD NUMBER         Patient presents with nonspecific skin eruption to the left lateral neck and upper right chest wall.  Patient advised continue taking acyclovir and given a prescription for Kenalog and Atarax.  Advised to follow-up with treating doctor.      ____________________________________________   FINAL CLINICAL IMPRESSION(S) / ED DIAGNOSES  Final diagnoses:  Rash and nonspecific skin eruption     ED Discharge Orders          Ordered    triamcinolone ointment (KENALOG) 0.5 %  2 times daily        09/22/20 0814    hydrOXYzine (ATARAX/VISTARIL) 50 MG tablet  3 times daily PRN        09/22/20 0814             Note:  This document was prepared using Dragon voice recognition software and may include unintentional dictation errors.    Joni Reining, PA-C 09/22/20 1536    Jene Every, MD 10/02/20 [phone]

## 2020-09-22 NOTE — ED Triage Notes (Signed)
C/O intermittent itchy rash to body. Describes small breakouts over the past several week.  Small area of raised red bumps noted to right neck/ shoulder.  AAOx3.  Skin warm and dry. NAD

## 2020-09-22 NOTE — Discharge Instructions (Signed)
Continue previous medication and start Kenalog and Atarax as directed.

## 2020-10-19 ENCOUNTER — Ambulatory Visit: Payer: Self-pay | Admitting: Physician Assistant

## 2020-10-19 ENCOUNTER — Encounter: Payer: Self-pay | Admitting: Physician Assistant

## 2020-10-19 ENCOUNTER — Other Ambulatory Visit: Payer: Self-pay

## 2020-10-19 DIAGNOSIS — Z299 Encounter for prophylactic measures, unspecified: Secondary | ICD-10-CM

## 2020-10-19 DIAGNOSIS — A539 Syphilis, unspecified: Secondary | ICD-10-CM

## 2020-10-19 DIAGNOSIS — B009 Herpesviral infection, unspecified: Secondary | ICD-10-CM

## 2020-10-19 DIAGNOSIS — Z113 Encounter for screening for infections with a predominantly sexual mode of transmission: Secondary | ICD-10-CM

## 2020-10-19 MED ORDER — METRONIDAZOLE 500 MG PO TABS: 2000.0000 mg | ORAL_TABLET | Freq: Once | ORAL | 0 refills | Status: AC

## 2020-10-19 MED ORDER — ACYCLOVIR 800 MG PO TABS: 800.0000 mg | ORAL_TABLET | Freq: Every day | ORAL | 12 refills | Status: AC

## 2020-10-19 MED ORDER — ACYCLOVIR 400 MG PO TABS: 400.0000 mg | ORAL_TABLET | Freq: Two times a day (BID) | ORAL | 0 refills | Status: AC

## 2020-10-19 MED ORDER — DOXYCYCLINE HYCLATE 100 MG PO TABS: 100.0000 mg | ORAL_TABLET | Freq: Two times a day (BID) | ORAL | 0 refills | Status: AC

## 2020-10-19 NOTE — Progress Notes (Signed)
[facility] Department STI clinic/screening visit  Subjective:  Madeline Medina is a 25 y.o. female being seen today for an STI screening visit. The patient reports they do not have symptoms.  Patient reports that they do not desire a pregnancy in the next year.   They reported they are not interested in discussing contraception today.  No LMP recorded.   Patient has the following medical conditions:  There are no problems to display for this patient.   Chief Complaint  Patient presents with   SEXUALLY TRANSMITTED DISEASE    screening    HPI  Patient reports that she was called to come in for discussion of test results and possible treatment/re-treatment from results she had at [facility] urgent care.  States that she went to the urgent care at the beginning of August with lesions in her genital area and vaginal discharge.  States that her testing done at urgent care showed HSV, Trich and Syphilis.  States that she was given antibiotics to take for Trich and Syphilis.  States that she was also given some medicine for HSV and is now out of that.  Patient requests Rx for suppressive treatment for HSV.  Reports that she does not have any chronic conditions or take medicines regularly.  LMP was 10/12/2020 and using condoms as her BCM.  States last HIV test was 09/20/2020 and last pap was    See flowsheet for further details and programmatic requirements.    The following portions of the patient's history were reviewed and updated as appropriate: allergies, current medications, past medical history, past social history, past surgical history and problem list.  Objective:  There were no vitals filed for this visit.  Physical Exam Constitutional:      General: She is not in acute distress.    Appearance: Normal appearance.  HENT:     Head: Normocephalic and atraumatic.     Mouth/Throat:     Mouth: Mucous membranes are moist.     Pharynx: Oropharynx is clear. No oropharyngeal  exudate or posterior oropharyngeal erythema.  Eyes:     Conjunctiva/sclera: Conjunctivae normal.  Pulmonary:     Effort: Pulmonary effort is normal.  Musculoskeletal:     Cervical back: Neck supple. No tenderness.  Lymphadenopathy:     Cervical: No cervical adenopathy.  Skin:    General: Skin is warm and dry.     Findings: No bruising, erythema, lesion or rash.  Neurological:     Mental Status: She is alert and oriented to person, place, and time.  Psychiatric:        Mood and Affect: Mood normal.        Behavior: Behavior normal.        Thought Content: Thought content normal.        Judgment: Judgment normal.     Assessment and Plan:  Madeline Medina is a 25 y.o. female presenting to the [facility] Department for STI screening  1. Screening for STD (sexually transmitted disease) Patient into clinic without current symptoms. Reviewed with patient results from urgent care visit. Rec condoms with all sex. Await test results.  Counseled that RN will call if needs to RTC for treatment once results are back.  - Syphilis Serology, [facility]  2. Prophylactic measure Counseled patient that she was not given enough Doxycycline to appropriately treat Syphilis since she was only given that medicine for 1 week. Counseled patient that since she has had sex with an untreated partner, recommend  re-treatment for Trich and Syphilis. Patient requests treatment for HSV, suppressive therapy. Will give Metronidazole 2 g po at one time with food, no EtOH for 24 hr before and until 72 hr after completing medicine. Will give Doxycycline 100 mg #28 1 po BID for 14 days. Will give Acyclovir 400 mg #240 1 po BID for 120 days and Rx, handwritten, for Acyclovir 800 mg #30 1 po daily with refills for 1 year. Counseled patient that she should not have sex until she has completed her antibiotics and her partner has also been treated and completed his medicine. Enc to use OTC antifungal cream if  has itching during or just after treatment with antibiotics. - metroNIDAZOLE (FLAGYL) 500 MG tablet; Take 4 tablets (2,000 mg total) by mouth once for 1 dose.  Dispense: 4 tablet; Refill: 0 - doxycycline (VIBRA-TABS) 100 MG tablet; Take 1 tablet (100 mg total) by mouth 2 (two) times daily for 7 days.  Dispense: 14 tablet; Refill: 0 - acyclovir (ZOVIRAX) 400 MG tablet; Take 1 tablet (400 mg total) by mouth 2 (two) times daily.  Dispense: 240 tablet; Refill: 0      No follow-ups on file.  No future appointments.  Matt Holmes, PA

## 2020-10-22 NOTE — Progress Notes (Signed)
Chart reviewed by Pharmacist  Suzanne Walker PharmD, Contract Pharmacist at [facility]  

## 2020-10-25 ENCOUNTER — Emergency Department: Payer: BC Managed Care – PPO

## 2020-10-25 ENCOUNTER — Other Ambulatory Visit: Payer: Self-pay

## 2020-10-25 ENCOUNTER — Encounter: Payer: Self-pay | Admitting: Emergency Medicine

## 2020-10-25 ENCOUNTER — Emergency Department
Admission: EM | Admit: 2020-10-25 | Discharge: 2020-10-25 | Disposition: A | Discharge status: Home / Self Care | Payer: BC Managed Care – PPO | Attending: Emergency Medicine | Admitting: Emergency Medicine

## 2020-10-25 DIAGNOSIS — S93601A Unspecified sprain of right foot, initial encounter: Secondary | ICD-10-CM | POA: Insufficient documentation

## 2020-10-25 DIAGNOSIS — W228XXA Striking against or struck by other objects, initial encounter: Secondary | ICD-10-CM | POA: Insufficient documentation

## 2020-10-25 DIAGNOSIS — M79671 Pain in right foot: Secondary | ICD-10-CM | POA: Diagnosis not present

## 2020-10-25 DIAGNOSIS — F17210 Nicotine dependence, cigarettes, uncomplicated: Secondary | ICD-10-CM | POA: Diagnosis not present

## 2020-10-25 DIAGNOSIS — S99921A Unspecified injury of right foot, initial encounter: Secondary | ICD-10-CM | POA: Diagnosis present

## 2020-10-25 MED ORDER — MELOXICAM 15 MG PO TABS
15.0000 mg | ORAL_TABLET | Freq: Every day | ORAL | 2 refills | Status: DC
Start: 2020-10-25 — End: 2021-01-17

## 2020-10-25 NOTE — ED Provider Notes (Signed)
[facility] Emergency Department Provider Note  ____________________________________________   Event Date/Time   First MD Initiated Contact with Patient 10/25/20 1053     (approximate)  I have reviewed the triage vital signs and the nursing notes.   HISTORY  Chief Complaint Foot Injury    HPI Madeline Medina is a 25 y.o. female presents emergency department with right foot pain.  Patient states that she stubbed her toe and bent it backwards towards her.  Has bruising and tenderness along the toe and midfoot.  States I do not think it is broken but it is very painful to bear weight.  Patient is having trouble moving her toe up and down.  No fever or chills.  History reviewed. No pertinent past medical history.  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 Authorizing Provider  meloxicam (MOBIC) 15 MG tablet Take 1 tablet (15 mg total) by mouth daily. 10/25/20 10/25/21 Yes Traquan Duarte, Roselyn Bering, PA-C  acyclovir (ZOVIRAX) 400 MG tablet Take 1 tablet (400 mg total) by mouth 2 (two) times daily. 10/19/20   Matt Holmes, PA  acyclovir (ZOVIRAX) 800 MG tablet Take 1 tablet (800 mg total) by mouth daily. 10/19/20   Matt Holmes, PA  dicyclomine (BENTYL) 10 MG capsule Take 1 capsule (10 mg total) by mouth 4 (four) times daily for 14 days. 07/28/19 08/11/19  Orvil Feil, PA-C  doxycycline (VIBRA-TABS) 100 MG tablet Take 1 tablet (100 mg total) by mouth 2 (two) times daily for 7 days. 10/19/20 10/26/20  Matt Holmes, PA    Allergies Hydrocodone and Penicillins  No family history on file.  Social History Social History   Tobacco Use   Smoking status: Every Day    Packs/day: 0.50    Types: Cigarettes    Smokeless tobacco: Never  Vaping Use   Vaping Use: Never used  Substance Use Topics   Alcohol use: Yes    Comment: occasionally   Drug use: Not Currently    Types: Marijuana    Review of Systems  Constitutional: No fever/chills Eyes: No visual changes. ENT: No sore throat. Respiratory: Denies cough Cardiovascular: Denies chest pain Gastrointestinal: Denies abdominal pain Genitourinary: Negative for dysuria. Musculoskeletal: Negative for back pain.  Positive for right great toe pain Skin: Negative for rash. Psychiatric: no mood changes,     ____________________________________________   PHYSICAL EXAM:  VITAL SIGNS: ED Triage Vitals  Enc Vitals Group     BP 10/25/20 1025 106/70     Pulse Rate 10/25/20 1025 80     Resp 10/25/20 1025 16     Temp 10/25/20 1025 98.4 F (36.9 C)     Temp Source 10/25/20 1025 Oral     SpO2 10/25/20 1025 98 %     Weight 10/25/20 1023 (!) 320 lb 1.7 oz (145.2 kg)     Height 10/25/20 1023 5\' 10"  (1.778 m)     Head Circumference --      Peak Flow --      Pain Score 10/25/20 1023 7     Pain Loc --      Pain Edu? --      Excl. in GC? --  Constitutional: Alert and oriented. Well appearing and in no acute distress. Eyes: Conjunctivae are normal.  Head: Atraumatic. Nose: No congestion/rhinnorhea. Mouth/Throat: Mucous membranes are moist.   Neck:  supple no lymphadenopathy noted Cardiovascular: Normal rate, regular rhythm.  Respiratory: Normal respiratory effort.  No retractions, GU: deferred Musculoskeletal: FROM all extremities, warm and well perfused, pain is reproduced with movement of the right great toe, area is tender along the toe and first metatarsal.  Neurovascular is intact Neurologic:  Normal speech and language.  Skin:  Skin is warm, dry and intact. No rash noted. Psychiatric: Mood and affect are normal. Speech and behavior are normal.  ____________________________________________   LABS (all labs ordered are listed,  but only abnormal results are displayed)  Labs Reviewed - No data to display ____________________________________________   ____________________________________________  RADIOLOGY  X-ray of the right foot  ____________________________________________   PROCEDURES  Procedure(s) performed: Short cam walker  Procedures    ____________________________________________   INITIAL IMPRESSION / ASSESSMENT AND PLAN / ED COURSE  Pertinent labs & imaging results that were available during my care of the patient were reviewed by me and considered in my medical decision making (see chart for details).   Patient is a 25 year old female presents emergency department complaining of right foot pain.  See HPI.  Physical exam shows patient were stable  X-ray of the right foot reviewed by me confirmed by radiology to be negative for any acute injury  The patient works in an area where the floors get wet, feels she will do better in a short walking boot versus a postop shoe.  She was given a prescription for meloxicam, given a work note for today along with instructions to allow her to wear the boot for 1 week.  She is to follow-up with podiatry if not improving in 1 week.  She is discharged stable condition.  Comfort measures were discussed     [name] was evaluated in Emergency Department on 10/25/2020 for the symptoms described in the history of present illness. She was evaluated in the context of the global COVID-19 pandemic, which necessitated consideration that the patient might be at risk for infection with the SARS-CoV-2 virus that causes COVID-19. Institutional protocols and algorithms that pertain to the evaluation of patients at risk for COVID-19 are in a state of rapid change based on information released by regulatory bodies including the CDC and federal and state organizations. These policies and algorithms were followed during the patient's care in the ED.    As part of my  medical decision making, I reviewed the following data within the electronic MEDICAL RECORD NUMBER Nursing notes reviewed and incorporated, Old chart reviewed, Radiograph reviewed , Notes from prior ED visits, and [place] Controlled Substance Database  ____________________________________________   FINAL CLINICAL IMPRESSION(S) / ED DIAGNOSES  Final diagnoses:  Sprain of right foot, initial encounter      NEW MEDICATIONS STARTED DURING THIS VISIT:  Discharge Medication List as of 10/25/2020 11:47 AM     START taking these medications   Details  meloxicam (MOBIC) 15 MG tablet Take 1 tablet (15 mg total) by mouth daily., Starting Wed 10/25/2020, Until Thu 10/25/2021, Normal         Note:  This document was prepared using Dragon voice recognition software and may include unintentional dictation errors.    Faythe Ghee, PA-C 10/25/20 1443    Gilles Chiquito, MD 10/25/20 [phone]

## 2020-10-25 NOTE — Discharge Instructions (Addendum)
Follow-up with your regular doctor if not improving in 2 to 3 days.  Return emergency department worsening.  Wear the boot while bearing weight.  Otherwise just apply ice.

## 2020-10-25 NOTE — ED Notes (Signed)
See triage note  Presents with pain to right great toe  States she fell yesterday  Bent back her great toe

## 2020-10-25 NOTE — ED Triage Notes (Signed)
C/O tripping last night and right great toe bent backward. C/O pain to right great toe pain.

## 2021-01-17 ENCOUNTER — Ambulatory Visit
Admission: RE | Admit: 2021-01-17 | Discharge: 2021-01-17 | Disposition: A | Discharge status: Home / Self Care | Payer: BC Managed Care – PPO | Source: Ambulatory Visit | Attending: Emergency Medicine | Admitting: Emergency Medicine

## 2021-01-17 ENCOUNTER — Other Ambulatory Visit: Payer: Self-pay

## 2021-01-17 VITALS — BP 105/65 | HR 55 | Temp 98.3°F | Resp 16

## 2021-01-17 DIAGNOSIS — J0140 Acute pansinusitis, unspecified: Secondary | ICD-10-CM

## 2021-01-17 DIAGNOSIS — Z1152 Encounter for screening for COVID-19: Secondary | ICD-10-CM

## 2021-01-17 MED ORDER — DOXYCYCLINE HYCLATE 100 MG PO CAPS: 100.0000 mg | ORAL_CAPSULE | Freq: Two times a day (BID) | ORAL | 0 refills | Status: AC

## 2021-01-17 MED ORDER — FLUTICASONE PROPIONATE 50 MCG/ACT NA SUSP: 2.0000 | Freq: Every day | NASAL | 0 refills | Status: AC

## 2021-01-17 MED ORDER — IBUPROFEN 600 MG PO TABS: 600.0000 mg | ORAL_TABLET | Freq: Four times a day (QID) | ORAL | 0 refills | Status: AC | PRN

## 2021-01-17 NOTE — ED Triage Notes (Signed)
Pt has had body aches, chills and congestion x 6 days. Was exposed to COVID by family during Thanksgiving holiday.

## 2021-01-17 NOTE — Discharge Instructions (Addendum)
Start Mucinex-D to keep the mucous thin and to decongest you.   You may take 600 mg of motrin with 1000 mg of tylenol up to 3-4 times a day as needed for pain. This is an effective combination for pain.  Most sinus infections are viral and do not need antibiotics unless you have a high fever, have had this for 10 days, or you get better and then get sick again.  I would wait a few days to start the doxycycline.  Use a NeilMed sinus rinse with distilled water as often as you want to to reduce nasal congestion. Follow the directions on the box.  Here is a list of primary care providers who are taking new patients:  Dr. Elizabeth Sauer [address] [place] [address] [phone]  [facility] Primary [facility] at [facility] [address] [place], [place] [address] [phone]  [facility] Primary Care [place] [address] [place] [address]  [phone]  [facility] [address] [place], [place] [address] [phone]  [facility] [address] [place]  [phone] [place], [place] [address]  Here are clinics/ other resources who will see you if you do not have insurance. Some have certain criteria that you must meet. Call them and find out what they are:  [facility]: [address] [place], [place] [address] Phone: [phone] Hours: First and Third Saturdays of each Month, 9 a.m. - 1 p.m.  [facility]: [address] [place], [place] [address] Phone: [phone] Hours: Tuesday, 4 p.m. - 8 p.m. Thursday, 1 p.m. - 8 p.m. Wednesday, 9 a.m. - [facility] [address], [place], [place] [address] Phone: [phone] Pharmacy Phone Number: [phone] Dental Phone Number: [phone] [facility] Insurance Help: [phone]  Dental Hours: Monday - Thursday, 8 a.m. - 6 p.m.  [facility] [address] [place], [place] [address] Phone: [phone] Pharmacy Phone Number: [phone] [facility]  Insurance Help: [phone]  [facility] [address] [place], [place] [address] Phone: [phone] Pharmacy Phone Number: [phone] [facility] Insurance Help: [phone]  [facility] [address] [place], [place] [address] Phone: [phone] [facility] Insurance Help: [phone]   [facility] [address] [place], [place] [address] Phone: [phone]  Go to www.goodrx.com  or www.costplusdrugs.com to look up your medications. This will give you a list of where you can find your prescriptions at the most affordable prices. Or ask the pharmacist what the cash price is, or if they have any other discount programs available to help make your medication more affordable. This can be less expensive than what you would pay with insurance.

## 2021-01-17 NOTE — ED Provider Notes (Signed)
HPI  SUBJECTIVE:  Madeline Medina is a 25 y.o. female who presents with 6 days of body aches, headaches, nasal congestion, sinus pain and pressure behind her right eye, postnasal drip, loss of sense of smell and taste, cough productive of greenish sputum.  She reports feeling feverish with chills, but does not have a thermometer at home.  She reports diarrhea yesterday, which has resolved.  No sore throat, wheezing or shortness of breath, nausea, vomiting, abdominal pain.  She was exposed to COVID last week.  She did not get the COVID-vaccine.  No known exposure to influenza.  She did not get the flu vaccine.  She states that she is starting to feel better today, but states that she got better one-point, and then got worse again.  She has no past medical history.  PMD: None  History reviewed. No pertinent past medical history.  Past Surgical History:  Procedure Laterality Date   ECTOPIC PREGNANCY SURGERY     UNILATERAL SALPINGECTOMY Right     History reviewed. No pertinent family history.  Social History   Tobacco Use   Smoking status: Every Day    Packs/day: 0.50    Types: Cigarettes   Smokeless tobacco: Never  Vaping Use   Vaping Use: Never used  Substance Use Topics   Alcohol use: Yes    Comment: occasionally   Drug use: Not Currently    Types: Marijuana    No current facility-administered medications for this encounter.  Current Outpatient Medications:    doxycycline (VIBRAMYCIN) 100 MG capsule, Take 1 capsule (100 mg total) by mouth 2 (two) times daily for 10 days., Disp: 20 capsule, Rfl: 0   fluticasone (FLONASE) 50 MCG/ACT nasal spray, Place 2 sprays into both nostrils daily., Disp: 16 g, Rfl: 0   ibuprofen (ADVIL) 600 MG tablet, Take 1 tablet (600 mg total) by mouth every 6 (six) hours as needed., Disp: 30 tablet, Rfl: 0   acyclovir (ZOVIRAX) 400 MG tablet, Take 1 tablet (400 mg total) by mouth 2 (two) times daily., Disp: 240 tablet, Rfl: 0   acyclovir (ZOVIRAX) 800 MG  tablet, Take 1 tablet (800 mg total) by mouth daily., Disp: 30 tablet, Rfl: 12   dicyclomine (BENTYL) 10 MG capsule, Take 1 capsule (10 mg total) by mouth 4 (four) times daily for 14 days., Disp: 56 capsule, Rfl: 0  Allergies  Allergen Reactions   Hydrocodone Hives   Penicillins Hives     ROS  As noted in HPI.   Physical Exam  BP 105/65   Pulse (!) 55   Temp 98.3 F (36.8 C) (Oral)   Resp 16   SpO2 98%   Constitutional: Well developed, well nourished, no acute distress Eyes:  EOMI, conjunctiva normal bilaterally HENT: Normocephalic, atraumatic,mucus membranes moist.  Nasal congestion.  Normal turbinates.  Positive maxillary, frontal sinus tenderness worse on the right.  No obvious postnasal drip Respiratory: Normal inspiratory effort, lungs clear bilaterally. Cardiovascular: Normal rate, regular rhythm, no murmurs rubs or gallop GI: nondistended skin: No rash, skin intact Musculoskeletal: no deformities Neurologic: Alert & oriented x 3, no focal neuro deficits Psychiatric: Speech and behavior appropriate   ED Course   Medications - No data to display  Orders Placed This Encounter  Procedures   Covid-19, Flu A+B ([facility])    Standing Status:   Standing    Number of Occurrences:   1    No results found for this or any previous visit (from the past 24 hour(s)). No results found.  ED Clinical Impression  1. Acute non-recurrent pansinusitis   2. Encounter for screening for COVID-19      ED Assessment/Plan  Suspect COVID infection with secondary sinusitis.  Unfortunately, she is out of the window for Molnupiravir or Tamiflu.  Home with Flonase, saline nasal rogation, Mucinex D, Tylenol/ibuprofen, wait-and-see prescription of doxycycline.  Will provide list for primary care and order assistance in finding a PMD.  Work note for 2 days  Discussed labs,  MDM, treatment plan, and plan for follow-up with patient.  patient agrees with plan.   Meds ordered this  encounter  Medications   fluticasone (FLONASE) 50 MCG/ACT nasal spray    Sig: Place 2 sprays into both nostrils daily.    Dispense:  16 g    Refill:  0   ibuprofen (ADVIL) 600 MG tablet    Sig: Take 1 tablet (600 mg total) by mouth every 6 (six) hours as needed.    Dispense:  30 tablet    Refill:  0   doxycycline (VIBRAMYCIN) 100 MG capsule    Sig: Take 1 capsule (100 mg total) by mouth 2 (two) times daily for 10 days.    Dispense:  20 capsule    Refill:  0      *This clinic note was created using Scientist, clinical (histocompatibility and immunogenetics). Therefore, there may be occasional mistakes despite careful proofreading.  ?    Domenick Gong, MD 01/17/21 1801

## 2021-01-18 LAB — COVID-19, FLU A+B NAA
Influenza A, NAA: NOT DETECTED
Influenza B, NAA: NOT DETECTED
SARS-CoV-2, NAA: DETECTED — AB

## 2021-03-01 ENCOUNTER — Ambulatory Visit: Payer: Self-pay

## 2021-03-24 ENCOUNTER — Other Ambulatory Visit: Payer: Self-pay

## 2021-03-24 ENCOUNTER — Ambulatory Visit
Admission: RE | Admit: 2021-03-24 | Discharge: 2021-03-24 | Disposition: A | Discharge status: Home / Self Care | Payer: BC Managed Care – PPO | Source: Ambulatory Visit | Attending: Family Medicine | Admitting: Family Medicine

## 2021-03-24 VITALS — BP 123/84 | HR 71 | Temp 98.8°F | Resp 18

## 2021-03-24 DIAGNOSIS — J019 Acute sinusitis, unspecified: Secondary | ICD-10-CM | POA: Diagnosis not present

## 2021-03-24 DIAGNOSIS — R300 Dysuria: Secondary | ICD-10-CM | POA: Insufficient documentation

## 2021-03-24 DIAGNOSIS — N760 Acute vaginitis: Secondary | ICD-10-CM | POA: Diagnosis not present

## 2021-03-24 LAB — POCT URINALYSIS DIP (MANUAL ENTRY)
Bilirubin, UA: NEGATIVE
Glucose, UA: NEGATIVE mg/dL
Ketones, POC UA: NEGATIVE mg/dL
Nitrite, UA: NEGATIVE
Protein Ur, POC: 30 mg/dL — AB
Spec Grav, UA: 1.02 (ref 1.010–1.025)
Urobilinogen, UA: 1 E.U./dL
pH, UA: 8 (ref 5.0–8.0)

## 2021-03-24 LAB — POCT URINE PREGNANCY: Preg Test, Ur: NEGATIVE

## 2021-03-24 MED ORDER — LEVOCETIRIZINE DIHYDROCHLORIDE 5 MG PO TABS: 5.0000 mg | ORAL_TABLET | Freq: Every evening | ORAL | 0 refills | Status: AC

## 2021-03-24 MED ORDER — FLUCONAZOLE 150 MG PO TABS: 150.0000 mg | ORAL_TABLET | Freq: Every day | ORAL | 0 refills | Status: DC

## 2021-03-24 MED ORDER — AZITHROMYCIN 250 MG PO TABS: ORAL_TABLET | ORAL | 0 refills | Status: DC

## 2021-03-24 NOTE — ED Triage Notes (Signed)
Pt presents with vaginal itching and discharge x 3 days. She reports a HA x3 weeks

## 2021-03-24 NOTE — Discharge Instructions (Addendum)
Vaginal cytology will result within 2 to 3 days.  I will wait and treat you for yeast infection with Diflucan take 1 tablet today and repeat in 3 days this will also prevent you from redeveloping a yeast infection from the antibiotic.  Start antibiotic today take as directed.  I also prescribed Jublia for cetirizine which is an antihistamine which can prevent you from having recurrent yeast infections start taking daily at nighttime.

## 2021-03-24 NOTE — ED Provider Notes (Signed)
[facility]    CSN: [id number] Arrival date & time: 03/24/21  1058      History   Chief Complaint Chief Complaint  Patient presents with   Vaginal Discharge   Vaginal Itching   Headache    HPI Madeline Medina is a 26 y.o. female.   HPI Patient presents today with vaginal discharge, vaginal itching x  and headache x 3 weeks. Patient complains of a few days of vaginal discharge which she reports is odorous and vaginal itching. She reports a prior history of BV and yeast. She declines STD testing would like to be checked for yeast and BV only. Denies any UTI symptoms with the exception of burning with urination which attribute to vaginal irritation. Patient complains of headache and facial pressure with congestion x 3 weeks. She reports recurrent problems with sinuses and she doesn't take a daily antihistamine. Denies any associated fever, cough, or shortness of breath. Request work note.  History reviewed. No pertinent past medical history.  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      Home Medications    Prior to Admission medications   Medication Sig Start Date End Date Taking? Authorizing Provider  azithromycin (ZITHROMAX) 250 MG tablet Take 2 tabs PO x 1 dose, then 1 tab PO QD x 4 days 03/24/21  Yes Bing NeighborsHarris, Lakeith Careaga S, FNP  fluconazole (DIFLUCAN) 150 MG tablet Take 1 tablet (150 mg total) by mouth daily. 03/24/21  Yes Bing NeighborsHarris, Nolton Denis S, FNP  levocetirizine (XYZAL) 5 MG tablet Take 1 tablet (5 mg total) by mouth every evening. 03/24/21  Yes Bing NeighborsHarris, Areyana Leoni S, FNP  acyclovir (ZOVIRAX) 400 MG tablet Take 1 tablet (400 mg total) by mouth 2 (two) times daily. 10/19/20   Matt HolmesHampton, Carla J, PA  acyclovir (ZOVIRAX) 800 MG tablet Take 1 tablet (800 mg  total) by mouth daily. 10/19/20   Matt HolmesHampton, Carla J, PA  dicyclomine (BENTYL) 10 MG capsule Take 1 capsule (10 mg total) by mouth 4 (four) times daily for 14 days. 07/28/19 08/11/19  Orvil FeilWoods, Jaclyn M, PA-C  fluticasone (FLONASE) 50 MCG/ACT nasal spray Place 2 sprays into both nostrils daily. 01/17/21   Domenick GongMortenson, Ashley, MD  ibuprofen (ADVIL) 600 MG tablet Take 1 tablet (600 mg total) by mouth every 6 (six) hours as needed. 01/17/21   Domenick GongMortenson, Ashley, MD    Family History History reviewed. No pertinent family history.  Social History Social History   Tobacco Use   Smoking status: Every Day    Packs/day: 0.50    Types: Cigarettes   Smokeless tobacco: Never  Vaping Use   Vaping Use: Never used  Substance Use Topics   Alcohol use: Yes    Comment: occasionally   Drug use: Not Currently    Types: Marijuana     Allergies   Hydrocodone and Penicillins   Review of Systems Review of Systems Pertinent negatives listed in HPI   Physical Exam Triage Vital Signs ED Triage Vitals  Enc Vitals Group     BP 03/24/21 1120 123/84     Pulse Rate 03/24/21 1120 71     Resp 03/24/21 1120 18     Temp 03/24/21 1120 98.8 F (37.1 C)     Temp Source  03/24/21 1120 Oral     SpO2 03/24/21 1120 96 %     Weight --      Height --      Head Circumference --      Peak Flow --      Pain Score 03/24/21 1127 0     Pain Loc --      Pain Edu? --      Excl. in GC? --    No data found.  Updated Vital Signs BP 123/84 (BP Location: Left Arm)    Pulse 71    Temp 98.8 F (37.1 C) (Oral)    Resp 18    LMP 03/15/2021 (Approximate)    SpO2 96%   Visual Acuity Right Eye Distance:   Left Eye Distance:   Bilateral Distance:    Right Eye Near:   Left Eye Near:    Bilateral Near:     Physical Exam  General Appearance:    Alert, cooperative, no distress  HENT:   Normocephalic, ears normal, nares mucosal edema with congestion, rhinorrhea, oropharynx  patent   Eyes:    PERRL, conjunctiva/corneas clear,  EOM's intact       Lungs:     Clear to auscultation bilaterally, respirations unlabored  Heart:    Regular rate and rhythm  Neurologic:   Awake, alert, oriented x 3. No apparent focal neurological           defect.   Vaginal cytology self collected, vaginal exam deferred   UC Treatments / Results  Labs (all labs ordered are listed, but only abnormal results are displayed) Labs Reviewed  POCT URINALYSIS DIP (MANUAL ENTRY) - Abnormal; Notable for the following components:      Result Value   Clarity, UA cloudy (*)    Blood, UA trace-intact (*)    Protein Ur, POC =30 (*)    Leukocytes, UA Trace (*)    All other components within normal limits  URINE CULTURE  POCT URINE PREGNANCY  CERVICOVAGINAL ANCILLARY ONLY    EKG   Radiology No results found.  Procedures Procedures (including critical care time)  Medications Ordered in UC Medications - No data to display  Initial Impression / Assessment and Plan / UC Course  I have reviewed the triage vital signs and the nursing notes.  Pertinent labs & imaging results that were available during my care of the patient were reviewed by me and considered in my medical decision making (see chart for details).    Vaginitis tx with diflucan, vaginal cytology pending. Acute sinusitis tx with Azithromycin due to penicillin allergy. Start daily antihistamine to prevent recurrent sinus infection Dysura UA slightly abnormal,will culture urine to rule out active infection. RTC PRN Final Clinical Impressions(s) / UC Diagnoses   Final diagnoses:  Vaginitis and vulvovaginitis  Acute non-recurrent sinusitis, unspecified location  Dysuria     Discharge Instructions      Vaginal cytology will result within 2 to 3 days.  I will wait and treat you for yeast infection with Diflucan take 1 tablet today and repeat in 3 days this will also prevent you from redeveloping a yeast infection from the antibiotic.  Start antibiotic today take as directed.   I also prescribed Jublia for cetirizine which is an antihistamine which can prevent you from having recurrent yeast infections start taking daily at nighttime.     ED Prescriptions     Medication Sig Dispense Auth. Provider   levocetirizine (XYZAL) 5 MG tablet Take 1 tablet (5  mg total) by mouth every evening. 90 tablet Bing Neighbors, FNP   azithromycin (ZITHROMAX) 250 MG tablet Take 2 tabs PO x 1 dose, then 1 tab PO QD x 4 days 6 tablet Bing Neighbors, FNP   fluconazole (DIFLUCAN) 150 MG tablet Take 1 tablet (150 mg total) by mouth daily. 2 tablet Bing Neighbors, FNP      PDMP not reviewed this encounter.   Bing Neighbors, FNP 03/24/21 1216

## 2021-03-26 LAB — CERVICOVAGINAL ANCILLARY ONLY
Bacterial Vaginitis (gardnerella): POSITIVE — AB
Candida Glabrata: NEGATIVE
Candida Vaginitis: POSITIVE — AB
Comment: NEGATIVE
Comment: NEGATIVE
Comment: NEGATIVE

## 2021-03-27 ENCOUNTER — Telehealth (HOSPITAL_COMMUNITY): Payer: Self-pay | Admitting: Emergency Medicine

## 2021-03-27 LAB — URINE CULTURE: Culture: >=100000 — AB

## 2021-03-27 MED ORDER — METRONIDAZOLE 500 MG PO TABS: 500.0000 mg | ORAL_TABLET | Freq: Two times a day (BID) | ORAL | 0 refills | Status: DC

## 2021-03-27 MED ORDER — FLUCONAZOLE 150 MG PO TABS: 150.0000 mg | ORAL_TABLET | Freq: Once | ORAL | 0 refills | Status: AC

## 2021-03-27 MED ORDER — NITROFURANTOIN MONOHYD MACRO 100 MG PO CAPS: 100.0000 mg | ORAL_CAPSULE | Freq: Two times a day (BID) | ORAL | 0 refills | Status: DC

## 2021-03-31 ENCOUNTER — Other Ambulatory Visit: Payer: Self-pay

## 2021-03-31 ENCOUNTER — Emergency Department: Payer: BC Managed Care – PPO

## 2021-03-31 ENCOUNTER — Emergency Department
Admission: EM | Admit: 2021-03-31 | Discharge: 2021-03-31 | Disposition: A | Discharge status: Home / Self Care | Payer: BC Managed Care – PPO | Attending: Emergency Medicine | Admitting: Emergency Medicine

## 2021-03-31 ENCOUNTER — Encounter: Payer: Self-pay | Admitting: Intensive Care

## 2021-03-31 DIAGNOSIS — D72829 Elevated white blood cell count, unspecified: Secondary | ICD-10-CM | POA: Insufficient documentation

## 2021-03-31 DIAGNOSIS — N390 Urinary tract infection, site not specified: Secondary | ICD-10-CM | POA: Insufficient documentation

## 2021-03-31 DIAGNOSIS — R1030 Lower abdominal pain, unspecified: Secondary | ICD-10-CM | POA: Diagnosis present

## 2021-03-31 LAB — COMPREHENSIVE METABOLIC PANEL
ALT: 17 U/L (ref 0–44)
AST: 18 U/L (ref 15–41)
Albumin: 3.2 g/dL — ABNORMAL LOW (ref 3.5–5.0)
Alkaline Phosphatase: 46 U/L (ref 38–126)
Anion gap: 6 (ref 5–15)
BUN: 15 mg/dL (ref 6–20)
CO2: 24 mmol/L (ref 22–32)
Calcium: 8.5 mg/dL — ABNORMAL LOW (ref 8.9–10.3)
Chloride: 108 mmol/L (ref 98–111)
Creatinine, Ser: 0.89 mg/dL (ref 0.44–1.00)
GFR, Estimated: >60 mL/min (ref >60)
Glucose, Bld: 113 mg/dL — ABNORMAL HIGH (ref 70–99)
Potassium: 3.8 mmol/L (ref 3.5–5.1)
Sodium: 138 mmol/L (ref 135–145)
Total Bilirubin: 0.4 mg/dL (ref 0.3–1.2)
Total Protein: 6.6 g/dL (ref 6.5–8.1)

## 2021-03-31 LAB — URINALYSIS, COMPLETE (UACMP) WITH MICROSCOPIC
Bilirubin Urine: NEGATIVE
Glucose, UA: NEGATIVE mg/dL
Hgb urine dipstick: NEGATIVE
Ketones, ur: NEGATIVE mg/dL
Nitrite: NEGATIVE
Protein, ur: NEGATIVE mg/dL
Specific Gravity, Urine: 1.026 (ref 1.005–1.030)
Squamous Epithelial / HPF: >50 — ABNORMAL HIGH (ref 0–5)
pH: 6 (ref 5.0–8.0)

## 2021-03-31 LAB — CBC WITH DIFFERENTIAL/PLATELET
Abs Immature Granulocytes: 0.03 10*3/uL (ref 0.00–0.07)
Basophils Absolute: 0 10*3/uL (ref 0.0–0.1)
Basophils Relative: 0 %
Eosinophils Absolute: 0.1 10*3/uL (ref 0.0–0.5)
Eosinophils Relative: 1 %
HCT: 43.7 % (ref 36.0–46.0)
Hemoglobin: 14.1 g/dL (ref 12.0–15.0)
Immature Granulocytes: 0 %
Lymphocytes Relative: 29 %
Lymphs Abs: 2.8 10*3/uL (ref 0.7–4.0)
MCH: 29.2 pg (ref 26.0–34.0)
MCHC: 32.3 g/dL (ref 30.0–36.0)
MCV: 90.5 fL (ref 80.0–100.0)
Monocytes Absolute: 0.6 10*3/uL (ref 0.1–1.0)
Monocytes Relative: 6 %
Neutro Abs: 6.1 10*3/uL (ref 1.7–7.7)
Neutrophils Relative %: 64 %
Platelets: 348 10*3/uL (ref 150–400)
RBC: 4.83 MIL/uL (ref 3.87–5.11)
RDW: 12.8 % (ref 11.5–15.5)
WBC: 9.7 10*3/uL (ref 4.0–10.5)
nRBC: 0 % (ref 0.0–0.2)

## 2021-03-31 LAB — POC URINE PREG, ED: Preg Test, Ur: NEGATIVE

## 2021-03-31 MED ORDER — CIPROFLOXACIN HCL 500 MG PO TABS: 500.0000 mg | ORAL_TABLET | Freq: Two times a day (BID) | ORAL | 0 refills | Status: AC

## 2021-03-31 NOTE — ED Triage Notes (Signed)
Patient diagnosed with UTI 03/24/21 and placed on antibiotics. Reports missing some doses. C/o continued lower, left back pain.

## 2021-03-31 NOTE — ED Notes (Signed)
Patient provided with discharge instructions, script information, and follow-up information. Patient was also given a work note saying she could be out until the 27th of February by the provider. Patient ask that I add to her note that she could return before then if feeling better which I did. Patient ambulated with a steady gait out to the waiting room.

## 2021-03-31 NOTE — ED Provider Notes (Signed)
[facility] Provider Note    Event Date/Time   First MD Initiated Contact with Patient 03/31/21 1856     (approximate)   History   Back Pain and Urinary Tract Infection   HPI  Madeline Medina is a 26 y.o. female with recent UTI presents emergency department with lower abdominal pain and back pain.  States she has not gotten better while taking the antibiotic.  She did miss a couple of doses but only has 2 doses left with her antibiotics.  Patient states she is starting to feel nauseated and has chills when she wakes up.  UTI was diagnosed on 03/24/2021.  She was seen in urgent care.  She denies vaginal discharge.  No history of kidney stones.      Physical Exam   Triage Vital Signs: ED Triage Vitals  Enc Vitals Group     BP 03/31/21 1802 130/71     Pulse Rate 03/31/21 1802 70     Resp 03/31/21 1802 18     Temp 03/31/21 1802 98.2 F (36.8 C)     Temp Source 03/31/21 1802 Oral     SpO2 03/31/21 1802 96 %     Weight 03/31/21 1804 (!) 320 lb (145.2 kg)     Height 03/31/21 1804 5\' 10"  (1.778 m)     Head Circumference --      Peak Flow --      Pain Score 03/31/21 1804 6     Pain Loc --      Pain Edu? --      Excl. in GC? --     Most recent vital signs: Vitals:   03/31/21 1802  BP: 130/71  Pulse: 70  Resp: 18  Temp: 98.2 F (36.8 C)  SpO2: 96%     General: Awake, no distress.   CV:  Good peripheral perfusion. regular rate and  rhythm Resp:  Normal effort. Lungs CTA Abd:  No distention.  Tender along the left flank, no CVA tenderness Other:      ED Results / Procedures / Treatments   Labs (all labs ordered are listed, but only abnormal results are displayed) Labs Reviewed  COMPREHENSIVE METABOLIC PANEL - Abnormal; Notable for the following components:      Result Value   Glucose, Bld 113 (*)    Calcium 8.5 (*)    Albumin 3.2 (*)    All other components within normal limits  URINALYSIS, COMPLETE (UACMP) WITH MICROSCOPIC - Abnormal;  Notable for the following components:   Color, Urine YELLOW (*)    APPearance CLOUDY (*)    Leukocytes,Ua SMALL (*)    Bacteria, UA RARE (*)    Squamous Epithelial / LPF >50 (*)    All other components within normal limits  CBC WITH DIFFERENTIAL/PLATELET  POC URINE PREG, ED     EKG     RADIOLOGY CT renal stone    PROCEDURES:   Procedures   MEDICATIONS ORDERED IN ED: Medications - No data to display   IMPRESSION / MDM / ASSESSMENT AND PLAN / ED COURSE  I reviewed the triage vital signs and the nursing notes.                              Differential diagnosis includes, but is not limited to, UTI, pyelonephritis, kidney stone, infected kidney stone  The patient's urine is positive for small amount of leuks, rare bacteria, however calcium oxalate crystals  are noted and mucus is present.  CBC is normal which is reassuring.  Comprehensive metabolic panel is also normal.  POC pregnancy is negative  Due to the continued flank pain along with UTI symptoms CT renal stone study ordered.  Told the patient we have concerns for pyelonephritis since she is starting to be nauseated and having chills.  CT renal stone study was reviewed by me.  Awaiting radiology read.  Radiologist read as negative for stone or pyelo no stranding was noted.  Had concerns he could see the tail of the pancreas, and recommended lipase since CT with contrast of concerns for pancreatitis.  Patient does not have any symptoms of pancreatitis so do not think this is necessary at this time.  In review of her past medical chart from the urgent care she had been given Zithromax which she has completed, she was also given Macrobid and Flagyl.  Had instructed the patient to stop the Macrobid as the urine culture grew out E. coli.  We will treat her with Cipro for 3 days.  She can take a probiotic daily to keep the vaginal flora and balance.  Patient would like a work note.  She was given 1 stating to excuse her for  days missed.  She was discharged stable condition.  Strict instructions to return if worsening.  If she develops vomiting or epigastric pain we could evaluate her for pancreatitis at that time.  Patient is in agreement treatment plan.        FINAL CLINICAL IMPRESSION(S) / ED DIAGNOSES   Final diagnoses:  Lower urinary tract infectious disease     Rx / DC Orders   ED Discharge Orders          Ordered    ciprofloxacin (CIPRO) 500 MG tablet  2 times daily        03/31/21 2018             Note:  This document was prepared using Dragon voice recognition software and may include unintentional dictation errors.    Faythe Ghee, PA-C 03/31/21 2022    Chesley Noon, MD 04/01/21 [phone]

## 2021-03-31 NOTE — Discharge Instructions (Signed)
Do not take the Macrobid.  If Flagyl is making you feel sick you can stop this and take a probiotic.  I would recommend taking a probiotic daily this will help keep the vaginal flora and the equal balance. cipro for 3 days due to the E. coli noted in the urine culture performed in urgent care. Ibuprofen or Tylenol for back pain.  Apply a warm wet compress to the lower back to help with muscular type pain

## 2021-08-03 ENCOUNTER — Other Ambulatory Visit: Payer: Self-pay

## 2021-08-03 ENCOUNTER — Emergency Department: Payer: Self-pay

## 2021-08-03 ENCOUNTER — Emergency Department
Admission: EM | Admit: 2021-08-03 | Discharge: 2021-08-04 | Disposition: A | Discharge status: Home / Self Care | Payer: Self-pay | Attending: Emergency Medicine | Admitting: Emergency Medicine

## 2021-08-03 DIAGNOSIS — J810 Acute pulmonary edema: Secondary | ICD-10-CM | POA: Insufficient documentation

## 2021-08-03 DIAGNOSIS — R0602 Shortness of breath: Secondary | ICD-10-CM

## 2021-08-03 DIAGNOSIS — M7989 Other specified soft tissue disorders: Secondary | ICD-10-CM | POA: Insufficient documentation

## 2021-08-03 HISTORY — DX: Cardiac murmur, unspecified: R01.1

## 2021-08-03 HISTORY — DX: Dextrocardia: Q24.0

## 2021-08-03 LAB — COMPREHENSIVE METABOLIC PANEL
ALT: 21 U/L (ref 0–44)
AST: 22 U/L (ref 15–41)
Albumin: 3.4 g/dL — ABNORMAL LOW (ref 3.5–5.0)
Alkaline Phosphatase: 36 U/L — ABNORMAL LOW (ref 38–126)
Anion gap: 6 (ref 5–15)
BUN: 18 mg/dL (ref 6–20)
CO2: 24 mmol/L (ref 22–32)
Calcium: 8.6 mg/dL — ABNORMAL LOW (ref 8.9–10.3)
Chloride: 111 mmol/L (ref 98–111)
Creatinine, Ser: 0.82 mg/dL (ref 0.44–1.00)
GFR, Estimated: >60 mL/min (ref >60)
Glucose, Bld: 80 mg/dL (ref 70–99)
Potassium: 3.9 mmol/L (ref 3.5–5.1)
Sodium: 141 mmol/L (ref 135–145)
Total Bilirubin: 0.6 mg/dL (ref 0.3–1.2)
Total Protein: 6.4 g/dL — ABNORMAL LOW (ref 6.5–8.1)

## 2021-08-03 LAB — CBC
HCT: 41.6 % (ref 36.0–46.0)
Hemoglobin: 13.2 g/dL (ref 12.0–15.0)
MCH: 28.1 pg (ref 26.0–34.0)
MCHC: 31.7 g/dL (ref 30.0–36.0)
MCV: 88.7 fL (ref 80.0–100.0)
Platelets: 286 10*3/uL (ref 150–400)
RBC: 4.69 MIL/uL (ref 3.87–5.11)
RDW: 12.7 % (ref 11.5–15.5)
WBC: 6.8 10*3/uL (ref 4.0–10.5)
nRBC: 0 % (ref 0.0–0.2)

## 2021-08-03 LAB — TROPONIN I (HIGH SENSITIVITY): Troponin I (High Sensitivity): 3 ng/L (ref <18)

## 2021-08-03 LAB — BRAIN NATRIURETIC PEPTIDE: B Natriuretic Peptide: 131.4 pg/mL — ABNORMAL HIGH (ref 0.0–100.0)

## 2021-08-03 MED ORDER — FUROSEMIDE 20 MG PO TABS: 20.0000 mg | ORAL_TABLET | Freq: Every day | ORAL | 0 refills | Status: DC

## 2021-08-03 MED ORDER — FUROSEMIDE 10 MG/ML IJ SOLN
40.0000 mg | Freq: Once | INTRAMUSCULAR | Status: AC
  Administered 2021-08-03: 40 mg via INTRAVENOUS
  Filled 2021-08-03: qty 4

## 2021-08-03 NOTE — ED Triage Notes (Signed)
Patient c/o bilateral lower extremity edema. 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

## 2021-08-03 NOTE — ED Notes (Signed)
O2 sat 98-100% while ambulating

## 2021-08-03 NOTE — ED Provider Notes (Signed)
[facility] Provider Note    Event Date/Time   First MD Initiated Contact with Patient 08/03/21 2134     (approximate)   History   Chief Complaint Leg Swelling  HPI  Carlon Saad is a 26 y.o. female with past medical history of dextrocardia who presents to the ED complaining of leg swelling.  Patient reports that she has had about 1 week of increasing swelling in both of her legs.  She denies any associated redness or pain, but does state that she has been getting slightly short of breath with exertion.  She denies any fevers, cough, or chest pain and has not had any difficulty breathing when lying flat.  She denies any history of cardiac problems beyond dextrocardia and does not currently follow with a cardiologist.     Physical Exam   Triage Vital Signs: ED Triage Vitals  Enc Vitals Group     BP 08/03/21 2112 122/85     Pulse Rate 08/03/21 2112 91     Resp 08/03/21 2112 17     Temp 08/03/21 2112 98.3 F (36.8 C)     Temp Source 08/03/21 2112 Oral     SpO2 08/03/21 2112 100 %     Weight 08/03/21 2113 (!) 320 lb (145.2 kg)     Height 08/03/21 2113 5\' 10"  (1.778 m)     Head Circumference --      Peak Flow --      Pain Score 08/03/21 2113 7     Pain Loc --      Pain Edu? --      Excl. in GC? --     Most recent vital signs: Vitals:   08/03/21 2112  BP: 122/85  Pulse: 91  Resp: 17  Temp: 98.3 F (36.8 C)  SpO2: 100%    Constitutional: Alert and oriented. Eyes: Conjunctivae are normal. Head: Atraumatic. Nose: No congestion/rhinnorhea. Mouth/Throat: Mucous membranes are moist.  Cardiovascular: Normal rate, regular rhythm. Grossly normal heart sounds.  2+ radial and DP pulses bilaterally. Respiratory: Normal respiratory effort.  No retractions. Lungs CTAB. Gastrointestinal: Soft and nontender. No distention. Musculoskeletal: No lower extremity tenderness, 1+ pitting edema to knees bilaterally. Neurologic:  Normal speech and language. No  gross focal neurologic deficits are appreciated.    ED Results / Procedures / Treatments   Labs (all labs ordered are listed, but only abnormal results are displayed) Labs Reviewed  COMPREHENSIVE METABOLIC PANEL - Abnormal; Notable for the following components:      Result Value   Calcium 8.6 (*)    Total Protein 6.4 (*)    Albumin 3.4 (*)    Alkaline Phosphatase 36 (*)    All other components within normal limits  BRAIN NATRIURETIC PEPTIDE - Abnormal; Notable for the following components:   B Natriuretic Peptide 131.4 (*)    All other components within normal limits  CBC  TROPONIN I (HIGH SENSITIVITY)   RADIOLOGY Chest x-ray reviewed by me with dextrocardia and mild pulmonary edema, no effusion or focal infiltrate noted.  PROCEDURES:  Critical Care performed: No  Procedures   MEDICATIONS ORDERED IN ED: Medications  furosemide (LASIX) injection 40 mg (40 mg Intravenous Given 08/03/21 2322)     IMPRESSION / MDM / ASSESSMENT AND PLAN / ED COURSE  I reviewed the triage vital signs and the nursing notes.  26 y.o. female with past medical history of dextrocardia who presents to the ED complaining of 1 week of increasing swelling in both legs along with some dyspnea on exertion.  Patient's presentation is most consistent with acute presentation with potential threat to life or bodily function.  Differential diagnosis includes, but is not limited to, ACS, PE, pneumonia, CHF, asthma, bronchitis, DVT, venous insufficiency.  Patient well-appearing and in no acute distress, vital signs are unremarkable and she is breathing comfortably on room air with oxygen saturation of 100%.  She has equal swelling bilaterally and no tenderness or erythema to suggest DVT or cellulitis.  She is neurovascularly intact to her bilateral lower extremities.  Labs are reassuring with normal renal function and no electrolyte abnormality, CBC with no anemia or leukocytosis,  troponin within normal limits and BNP very mildly elevated.  Chest x-ray redemonstrates patient's known dextrocardia, does also show some mild pulmonary edema.  Patient was ambulated on a pulse oximeter and remained greater than 95% with minimal difficulty breathing.  She was given a dose of IV Lasix and is appropriate for discharge home with outpatient cardiology follow-up.  She was counseled to return to the ED for new or worsening symptoms, patient agrees with plan.      FINAL CLINICAL IMPRESSION(S) / ED DIAGNOSES   Final diagnoses:  Shortness of breath  Acute pulmonary edema (HCC)     Rx / DC Orders   ED Discharge Orders          Ordered    furosemide (LASIX) 20 MG tablet  Daily        08/03/21 2322    AMB referral to CHF clinic        08/03/21 2323    Ambulatory referral to Cardiology        08/03/21 2323             Note:  This document was prepared using Dragon voice recognition software and may include unintentional dictation errors.   Chesley Noon, MD 08/03/21 [phone]

## 2021-08-07 ENCOUNTER — Telehealth: Payer: Self-pay | Admitting: Family

## 2021-08-07 NOTE — Telephone Encounter (Signed)
Called patient in attempt to schedule a new patient appointment due to hospital ED referral and left VM for patient to call back to get scheduled. I followed up with [facility] as well who stated they called in attempt to get an appointment with her yesterday and patient stated she already had a cardiologist.   Joice Lofts, NT

## 2021-09-05 ENCOUNTER — Emergency Department
Admission: EM | Admit: 2021-09-05 | Discharge: 2021-09-05 | Disposition: A | Discharge status: Home / Self Care | Payer: Self-pay | Attending: Emergency Medicine | Admitting: Emergency Medicine

## 2021-09-05 ENCOUNTER — Emergency Department: Payer: Self-pay

## 2021-09-05 ENCOUNTER — Other Ambulatory Visit: Payer: Self-pay

## 2021-09-05 DIAGNOSIS — R600 Localized edema: Secondary | ICD-10-CM | POA: Insufficient documentation

## 2021-09-05 DIAGNOSIS — R202 Paresthesia of skin: Secondary | ICD-10-CM | POA: Insufficient documentation

## 2021-09-05 DIAGNOSIS — R609 Edema, unspecified: Secondary | ICD-10-CM

## 2021-09-05 DIAGNOSIS — I509 Heart failure, unspecified: Secondary | ICD-10-CM | POA: Insufficient documentation

## 2021-09-05 LAB — BASIC METABOLIC PANEL
Anion gap: 1 — ABNORMAL LOW (ref 5–15)
BUN: 11 mg/dL (ref 6–20)
CO2: 26 mmol/L (ref 22–32)
Calcium: 8.2 mg/dL — ABNORMAL LOW (ref 8.9–10.3)
Chloride: 110 mmol/L (ref 98–111)
Creatinine, Ser: 0.82 mg/dL (ref 0.44–1.00)
GFR, Estimated: >60 mL/min (ref >60)
Glucose, Bld: 93 mg/dL (ref 70–99)
Potassium: 4.5 mmol/L (ref 3.5–5.1)
Sodium: 137 mmol/L (ref 135–145)

## 2021-09-05 LAB — POC URINE PREG, ED: Preg Test, Ur: NEGATIVE

## 2021-09-05 LAB — CBC
HCT: 44.3 % (ref 36.0–46.0)
Hemoglobin: 14 g/dL (ref 12.0–15.0)
MCH: 27.8 pg (ref 26.0–34.0)
MCHC: 31.6 g/dL (ref 30.0–36.0)
MCV: 88.1 fL (ref 80.0–100.0)
Platelets: 254 10*3/uL (ref 150–400)
RBC: 5.03 MIL/uL (ref 3.87–5.11)
RDW: 12.6 % (ref 11.5–15.5)
WBC: 5.8 10*3/uL (ref 4.0–10.5)
nRBC: 0 % (ref 0.0–0.2)

## 2021-09-05 LAB — TROPONIN I (HIGH SENSITIVITY): Troponin I (High Sensitivity): 3 ng/L (ref <18)

## 2021-09-05 LAB — CBG MONITORING, ED: Glucose-Capillary: 95 mg/dL (ref 70–99)

## 2021-09-05 LAB — TSH: TSH: 1.7 u[IU]/mL (ref 0.350–4.500)

## 2021-09-05 LAB — BRAIN NATRIURETIC PEPTIDE: B Natriuretic Peptide: 153.8 pg/mL — ABNORMAL HIGH (ref 0.0–100.0)

## 2021-09-05 LAB — T4, FREE: Free T4: 0.61 ng/dL (ref 0.61–1.12)

## 2021-09-05 MED ORDER — FUROSEMIDE 40 MG PO TABS
40.0000 mg | ORAL_TABLET | Freq: Once | ORAL | Status: AC
  Administered 2021-09-05: 40 mg via ORAL
  Filled 2021-09-05: qty 1

## 2021-09-05 MED ORDER — FUROSEMIDE 40 MG PO TABS
40.0000 mg | ORAL_TABLET | Freq: Every day | ORAL | 5 refills | Status: AC
  Filled 2021-09-05: qty 30, 30d supply, fill #0

## 2021-09-05 NOTE — ED Notes (Signed)
Urine sent if needed °

## 2021-09-05 NOTE — ED Triage Notes (Signed)
First Nurse Note:  C/O bilateral lower extremity swelling and bilateral hand numbness.  Swelling worse over past 3-4 days.  AAOx3.  Skin warm and dry. NAD. No SOB/ DOE

## 2021-09-05 NOTE — ED Provider Notes (Signed)
[facility] Emergency Department Provider Note     Event Date/Time   First MD Initiated Contact with Patient 09/05/21 1515     (approximate)   History   Numbness   HPI  Ashonti Lint is a 26 y.o. female with a history of recently diagnosed CHF presents to the ED for evaluation of persistent BLE edema and hand swelling. She also describes hand paresthesias, described as "pins and needles". She is taking her previously prescribed furosemide at 20 mg daily. She notes the pill initially seemed to work better, but now she does not feel she is peeing as frequently. She also notes her cardiologist started her on Losartan, which she started yesterday. She denies any significant SOB.    Physical Exam   Triage Vital Signs: ED Triage Vitals [09/05/21 1426]  Enc Vitals Group     BP 108/77     Pulse Rate 68     Resp 18     Temp 98.1 F (36.7 C)     Temp Source Oral     SpO2 99 %     Weight (!) 330 lb (149.7 kg)     Height 5\' 10"  (1.778 m)     Head Circumference      Peak Flow      Pain Score 7     Pain Loc      Pain Edu?      Excl. in GC?     Most recent vital signs: Vitals:   09/05/21 1426  BP: 108/77  Pulse: 68  Resp: 18  Temp: 98.1 F (36.7 C)  SpO2: 99%    General Awake, no distress.  CV:  Good peripheral perfusion. RRR. BLE pitting edema 2+ noted. RESP:  Normal effort. CTA ABD:  No distention.  NEURO: CN II-XII grossly intact  ED Results / Procedures / Treatments   Labs (all labs ordered are listed, but only abnormal results are displayed) Labs Reviewed  BASIC METABOLIC PANEL - Abnormal; Notable for the following components:      Result Value   Calcium 8.2 (*)    Anion gap 1 (*)    All other components within normal limits  BRAIN NATRIURETIC PEPTIDE - Abnormal; Notable for the following components:   B Natriuretic Peptide 153.8 (*)    All other components within normal limits  CBC  TSH  T4, FREE  CBG MONITORING, ED  POC  URINE PREG, ED  TROPONIN I (HIGH SENSITIVITY)     EKG   RADIOLOGY  I personally viewed and evaluated these images as part of my medical decision making, as well as reviewing the written report by the radiologist.  ED Provider Interpretation: no acute findings}  No results found.   PROCEDURES:  Critical Care performed: No  Procedures   MEDICATIONS ORDERED IN ED: Medications  furosemide (LASIX) tablet 40 mg (40 mg Oral Given 09/05/21 1614)     IMPRESSION / MDM / ASSESSMENT AND PLAN / ED COURSE  I reviewed the triage vital signs and the nursing notes.                              Differential diagnosis includes, but is not limited to, CHF exacerbation, hypothyroidism, CAP, electrolyte abnormality, radiculopathy  Patient's presentation is most consistent with acute complicated illness / injury requiring diagnostic workup.  Patient's diagnosis is consistent with CHF exacerbation with fluid retention. Patient with recently diagnosed CHF presenting with  ongoing BLE. She is evaluated and found to have an elevated BNP. No radiologic evidence of CAP or pulmonary effusion. Patient will be discharged home with prescriptions for Lasix 40 mg. Patient is to follow up with the CHF clinic and her cardiologist, as needed or otherwise directed. Patient is given ED precautions to return to the ED for any worsening or new symptoms.     FINAL CLINICAL IMPRESSION(S) / ED DIAGNOSES   Final diagnoses:  Peripheral edema  Acute congestive heart failure, unspecified heart failure type (HCC)     Rx / DC Orders   ED Discharge Orders          Ordered    furosemide (LASIX) 40 MG tablet  Daily        09/05/21 1609             Note:  This document was prepared using Dragon voice recognition software and may include unintentional dictation errors.    Lissa Hoard, PA-C 09/06/21 2332    Chesley Noon, MD 09/14/21 1105

## 2021-09-05 NOTE — Discharge Instructions (Addendum)
You labs reveal fluid overload due to your recently diagnosed heart failure. You should increase your Lasix to 40 mg every morning. You should follow-up with the [facility] for ongoing management. Monitor your BP and your weight for the next few weeks, and follow-up with Cardiology as scheduled. Return to the ED if needed.

## 2021-09-05 NOTE — ED Triage Notes (Signed)
Pt here with numbness to her hands and feet. Pt has been dealing with this for some time. Pt was on a fluid pill which helped for a while but the swelling has returned. Pt states she was also started on Losartan yesterday. Pt denies pain but endorses numbness that feels like "pins and needles".

## 2021-09-05 NOTE — ED Notes (Signed)
Pt presents to ED with c/o of bilateral numbness in hands. Pt states it has been going on for a couple of days. Pt states the numbness comes and goes and feels like a pins and needles  sensation. Pt also has bilateral swelling in both lower legs, feet, and hands. Pt states she takes 20 mg of lasix but feels the lasix is not working. Pt denies any Chest pain or SOB while at rest. Pt can currently feel sensation in hand at this time.

## 2021-09-05 NOTE — ED Notes (Signed)
This RN reviewed paperwork with pt. No further complaints or questions. Pt ambulated to lobby. Discharge form placed in med rec box.  

## 2021-09-06 ENCOUNTER — Telehealth: Payer: Self-pay | Admitting: Family

## 2021-09-06 NOTE — Telephone Encounter (Signed)
Called and LVM in attempt to get patient scheduled for a new patient CHF Clinic appointment after receiving a referral again from the ER.    Aviraj Kentner, NT

## 2021-09-21 ENCOUNTER — Other Ambulatory Visit: Payer: Self-pay

## 2021-10-22 DIAGNOSIS — N390 Urinary tract infection, site not specified: Secondary | ICD-10-CM | POA: Diagnosis not present

## 2021-10-22 DIAGNOSIS — Z885 Allergy status to narcotic agent status: Secondary | ICD-10-CM | POA: Diagnosis not present

## 2021-10-22 DIAGNOSIS — O99331 Smoking (tobacco) complicating pregnancy, first trimester: Secondary | ICD-10-CM | POA: Diagnosis not present

## 2021-10-22 DIAGNOSIS — F17210 Nicotine dependence, cigarettes, uncomplicated: Secondary | ICD-10-CM | POA: Diagnosis not present

## 2021-10-22 DIAGNOSIS — Z3A01 Less than 8 weeks gestation of pregnancy: Secondary | ICD-10-CM | POA: Diagnosis not present

## 2021-10-22 DIAGNOSIS — O2341 Unspecified infection of urinary tract in pregnancy, first trimester: Secondary | ICD-10-CM | POA: Diagnosis not present

## 2021-10-22 DIAGNOSIS — Z880 Allergy status to penicillin: Secondary | ICD-10-CM | POA: Diagnosis not present

## 2021-11-11 DIAGNOSIS — K8020 Calculus of gallbladder without cholecystitis without obstruction: Secondary | ICD-10-CM | POA: Diagnosis not present

## 2021-11-11 DIAGNOSIS — O26611 Liver and biliary tract disorders in pregnancy, first trimester: Secondary | ICD-10-CM | POA: Diagnosis not present

## 2021-11-11 DIAGNOSIS — R1011 Right upper quadrant pain: Secondary | ICD-10-CM | POA: Diagnosis not present

## 2021-11-11 DIAGNOSIS — R1013 Epigastric pain: Secondary | ICD-10-CM | POA: Diagnosis not present

## 2021-11-11 DIAGNOSIS — Z3A09 9 weeks gestation of pregnancy: Secondary | ICD-10-CM | POA: Diagnosis not present

## 2021-11-11 DIAGNOSIS — K8000 Calculus of gallbladder with acute cholecystitis without obstruction: Secondary | ICD-10-CM | POA: Diagnosis not present

## 2021-11-12 DIAGNOSIS — R609 Edema, unspecified: Secondary | ICD-10-CM | POA: Diagnosis not present

## 2021-11-12 DIAGNOSIS — Z8679 Personal history of other diseases of the circulatory system: Secondary | ICD-10-CM | POA: Diagnosis not present

## 2021-11-12 DIAGNOSIS — O26611 Liver and biliary tract disorders in pregnancy, first trimester: Secondary | ICD-10-CM | POA: Diagnosis not present

## 2021-11-12 DIAGNOSIS — Z3A09 9 weeks gestation of pregnancy: Secondary | ICD-10-CM | POA: Diagnosis not present

## 2021-11-12 DIAGNOSIS — Q263 Partial anomalous pulmonary venous connection: Secondary | ICD-10-CM | POA: Diagnosis not present

## 2021-11-12 DIAGNOSIS — K8060 Calculus of gallbladder and bile duct with cholecystitis, unspecified, without obstruction: Secondary | ICD-10-CM | POA: Diagnosis not present

## 2021-11-12 DIAGNOSIS — I517 Cardiomegaly: Secondary | ICD-10-CM | POA: Diagnosis not present

## 2021-11-12 DIAGNOSIS — Q240 Dextrocardia: Secondary | ICD-10-CM | POA: Diagnosis not present

## 2021-11-12 DIAGNOSIS — K8020 Calculus of gallbladder without cholecystitis without obstruction: Secondary | ICD-10-CM | POA: Diagnosis not present

## 2021-11-13 DIAGNOSIS — O26611 Liver and biliary tract disorders in pregnancy, first trimester: Secondary | ICD-10-CM | POA: Diagnosis not present

## 2021-11-13 DIAGNOSIS — K8010 Calculus of gallbladder with chronic cholecystitis without obstruction: Secondary | ICD-10-CM | POA: Diagnosis not present

## 2021-11-13 DIAGNOSIS — K8020 Calculus of gallbladder without cholecystitis without obstruction: Secondary | ICD-10-CM | POA: Diagnosis not present

## 2021-12-10 DIAGNOSIS — O99211 Obesity complicating pregnancy, first trimester: Secondary | ICD-10-CM | POA: Diagnosis not present

## 2021-12-10 DIAGNOSIS — Z1332 Encounter for screening for maternal depression: Secondary | ICD-10-CM | POA: Diagnosis not present

## 2021-12-10 DIAGNOSIS — F17200 Nicotine dependence, unspecified, uncomplicated: Secondary | ICD-10-CM | POA: Diagnosis not present

## 2021-12-10 DIAGNOSIS — K8010 Calculus of gallbladder with chronic cholecystitis without obstruction: Secondary | ICD-10-CM | POA: Diagnosis not present

## 2021-12-10 DIAGNOSIS — Z1389 Encounter for screening for other disorder: Secondary | ICD-10-CM | POA: Diagnosis not present

## 2021-12-10 DIAGNOSIS — O0990 Supervision of high risk pregnancy, unspecified, unspecified trimester: Secondary | ICD-10-CM | POA: Diagnosis not present

## 2021-12-10 DIAGNOSIS — Z3A13 13 weeks gestation of pregnancy: Secondary | ICD-10-CM | POA: Diagnosis not present

## 2021-12-10 DIAGNOSIS — F17210 Nicotine dependence, cigarettes, uncomplicated: Secondary | ICD-10-CM | POA: Diagnosis not present

## 2021-12-10 DIAGNOSIS — O99213 Obesity complicating pregnancy, third trimester: Secondary | ICD-10-CM | POA: Diagnosis not present

## 2021-12-10 DIAGNOSIS — O99820 Streptococcus B carrier state complicating pregnancy: Secondary | ICD-10-CM | POA: Diagnosis not present

## 2021-12-10 DIAGNOSIS — O26613 Liver and biliary tract disorders in pregnancy, third trimester: Secondary | ICD-10-CM | POA: Diagnosis not present

## 2021-12-10 DIAGNOSIS — O99333 Smoking (tobacco) complicating pregnancy, third trimester: Secondary | ICD-10-CM | POA: Diagnosis not present

## 2021-12-10 DIAGNOSIS — Z9289 Personal history of other medical treatment: Secondary | ICD-10-CM | POA: Diagnosis not present

## 2021-12-10 DIAGNOSIS — Z880 Allergy status to penicillin: Secondary | ICD-10-CM | POA: Diagnosis not present

## 2021-12-10 DIAGNOSIS — Z8679 Personal history of other diseases of the circulatory system: Secondary | ICD-10-CM | POA: Diagnosis not present

## 2021-12-10 DIAGNOSIS — O0993 Supervision of high risk pregnancy, unspecified, third trimester: Secondary | ICD-10-CM | POA: Diagnosis not present

## 2021-12-10 DIAGNOSIS — R8271 Bacteriuria: Secondary | ICD-10-CM | POA: Diagnosis not present

## 2021-12-10 DIAGNOSIS — Z3A28 28 weeks gestation of pregnancy: Secondary | ICD-10-CM | POA: Diagnosis not present

## 2021-12-16 DIAGNOSIS — Z20822 Contact with and (suspected) exposure to covid-19: Secondary | ICD-10-CM | POA: Diagnosis not present

## 2021-12-16 DIAGNOSIS — O99332 Smoking (tobacco) complicating pregnancy, second trimester: Secondary | ICD-10-CM | POA: Diagnosis not present

## 2021-12-16 DIAGNOSIS — Z3A14 14 weeks gestation of pregnancy: Secondary | ICD-10-CM | POA: Diagnosis not present

## 2021-12-16 DIAGNOSIS — Z880 Allergy status to penicillin: Secondary | ICD-10-CM | POA: Diagnosis not present

## 2021-12-16 DIAGNOSIS — B349 Viral infection, unspecified: Secondary | ICD-10-CM | POA: Diagnosis not present

## 2021-12-16 DIAGNOSIS — F17210 Nicotine dependence, cigarettes, uncomplicated: Secondary | ICD-10-CM | POA: Diagnosis not present

## 2021-12-16 DIAGNOSIS — Z9049 Acquired absence of other specified parts of digestive tract: Secondary | ICD-10-CM | POA: Diagnosis not present

## 2021-12-16 DIAGNOSIS — O99412 Diseases of the circulatory system complicating pregnancy, second trimester: Secondary | ICD-10-CM | POA: Diagnosis not present

## 2021-12-16 DIAGNOSIS — O98512 Other viral diseases complicating pregnancy, second trimester: Secondary | ICD-10-CM | POA: Diagnosis not present

## 2021-12-16 DIAGNOSIS — O99891 Other specified diseases and conditions complicating pregnancy: Secondary | ICD-10-CM | POA: Diagnosis not present

## 2021-12-16 DIAGNOSIS — R0602 Shortness of breath: Secondary | ICD-10-CM | POA: Diagnosis not present

## 2021-12-16 DIAGNOSIS — R9431 Abnormal electrocardiogram [ECG] [EKG]: Secondary | ICD-10-CM | POA: Diagnosis not present

## 2021-12-16 DIAGNOSIS — R3915 Urgency of urination: Secondary | ICD-10-CM | POA: Diagnosis not present

## 2021-12-16 DIAGNOSIS — R600 Localized edema: Secondary | ICD-10-CM | POA: Diagnosis not present

## 2021-12-16 DIAGNOSIS — R002 Palpitations: Secondary | ICD-10-CM | POA: Diagnosis not present

## 2021-12-16 DIAGNOSIS — I509 Heart failure, unspecified: Secondary | ICD-10-CM | POA: Diagnosis not present

## 2021-12-16 DIAGNOSIS — R918 Other nonspecific abnormal finding of lung field: Secondary | ICD-10-CM | POA: Diagnosis not present

## 2021-12-16 DIAGNOSIS — Z885 Allergy status to narcotic agent status: Secondary | ICD-10-CM | POA: Diagnosis not present

## 2021-12-16 DIAGNOSIS — J984 Other disorders of lung: Secondary | ICD-10-CM | POA: Diagnosis not present

## 2021-12-16 DIAGNOSIS — Q240 Dextrocardia: Secondary | ICD-10-CM | POA: Diagnosis not present

## 2021-12-17 DIAGNOSIS — B349 Viral infection, unspecified: Secondary | ICD-10-CM | POA: Diagnosis not present

## 2021-12-20 DIAGNOSIS — K8020 Calculus of gallbladder without cholecystitis without obstruction: Secondary | ICD-10-CM | POA: Diagnosis not present

## 2021-12-20 DIAGNOSIS — O26611 Liver and biliary tract disorders in pregnancy, first trimester: Secondary | ICD-10-CM | POA: Diagnosis not present

## 2022-01-16 DIAGNOSIS — Z3A18 18 weeks gestation of pregnancy: Secondary | ICD-10-CM | POA: Diagnosis not present

## 2022-01-16 DIAGNOSIS — Z3689 Encounter for other specified antenatal screening: Secondary | ICD-10-CM | POA: Diagnosis not present

## 2022-01-16 DIAGNOSIS — O352XX0 Maternal care for (suspected) hereditary disease in fetus, not applicable or unspecified: Secondary | ICD-10-CM | POA: Diagnosis not present

## 2022-01-18 DIAGNOSIS — Z331 Pregnant state, incidental: Secondary | ICD-10-CM | POA: Diagnosis not present

## 2022-01-18 DIAGNOSIS — I519 Heart disease, unspecified: Secondary | ICD-10-CM | POA: Diagnosis not present

## 2022-01-18 DIAGNOSIS — Q240 Dextrocardia: Secondary | ICD-10-CM | POA: Diagnosis not present

## 2022-01-18 DIAGNOSIS — I517 Cardiomegaly: Secondary | ICD-10-CM | POA: Diagnosis not present

## 2022-01-22 DIAGNOSIS — Z8679 Personal history of other diseases of the circulatory system: Secondary | ICD-10-CM | POA: Diagnosis not present

## 2022-01-22 DIAGNOSIS — O0991 Supervision of high risk pregnancy, unspecified, first trimester: Secondary | ICD-10-CM | POA: Diagnosis not present

## 2022-01-22 DIAGNOSIS — Z9289 Personal history of other medical treatment: Secondary | ICD-10-CM | POA: Diagnosis not present

## 2022-01-22 DIAGNOSIS — Z23 Encounter for immunization: Secondary | ICD-10-CM | POA: Diagnosis not present

## 2022-02-14 DIAGNOSIS — Z3A23 23 weeks gestation of pregnancy: Secondary | ICD-10-CM | POA: Diagnosis not present

## 2022-02-14 DIAGNOSIS — R8271 Bacteriuria: Secondary | ICD-10-CM | POA: Diagnosis not present

## 2022-02-14 DIAGNOSIS — O352XX0 Maternal care for (suspected) hereditary disease in fetus, not applicable or unspecified: Secondary | ICD-10-CM | POA: Diagnosis not present

## 2022-03-01 ENCOUNTER — Ambulatory Visit: Payer: Medicaid Other | Admitting: Advanced Practice Midwife

## 2022-03-01 ENCOUNTER — Encounter: Payer: Self-pay | Admitting: Advanced Practice Midwife

## 2022-03-01 DIAGNOSIS — F17200 Nicotine dependence, unspecified, uncomplicated: Secondary | ICD-10-CM | POA: Insufficient documentation

## 2022-03-01 DIAGNOSIS — Z113 Encounter for screening for infections with a predominantly sexual mode of transmission: Secondary | ICD-10-CM | POA: Diagnosis not present

## 2022-03-01 DIAGNOSIS — Z62810 Personal history of physical and sexual abuse in childhood: Secondary | ICD-10-CM | POA: Insufficient documentation

## 2022-03-01 DIAGNOSIS — T7411XS Adult physical abuse, confirmed, sequela: Secondary | ICD-10-CM

## 2022-03-01 DIAGNOSIS — Z202 Contact with and (suspected) exposure to infections with a predominantly sexual mode of transmission: Secondary | ICD-10-CM | POA: Insufficient documentation

## 2022-03-01 DIAGNOSIS — K7030 Alcoholic cirrhosis of liver without ascites: Secondary | ICD-10-CM | POA: Insufficient documentation

## 2022-03-01 DIAGNOSIS — F1290 Cannabis use, unspecified, uncomplicated: Secondary | ICD-10-CM | POA: Insufficient documentation

## 2022-03-01 DIAGNOSIS — T7411XA Adult physical abuse, confirmed, initial encounter: Secondary | ICD-10-CM | POA: Insufficient documentation

## 2022-03-01 DIAGNOSIS — F419 Anxiety disorder, unspecified: Secondary | ICD-10-CM | POA: Insufficient documentation

## 2022-03-01 LAB — WET PREP FOR TRICH, YEAST, CLUE
Trichomonas Exam: NEGATIVE
Yeast Exam: NEGATIVE

## 2022-03-01 LAB — HM HIV SCREENING LAB
HM HIV Screening: NEGATIVE
HM HIV Screening: NEGATIVE

## 2022-03-01 MED ORDER — METRONIDAZOLE 500 MG PO TABS: 500.0000 mg | ORAL_TABLET | Freq: Two times a day (BID) | ORAL | 0 refills | Status: AC

## 2022-03-01 NOTE — Progress Notes (Signed)
The patient was dispensed Metronidazole  today. I provided counseling today regarding the medication. We discussed the medication, the side effects and when to call clinic. Patient given the opportunity to ask questions. Questions answered. BTthiele RN

## 2022-03-01 NOTE — Progress Notes (Signed)
[facility] Department  STI clinic/screening visit [place] [address] [phone]  Subjective:  Madeline Medina is a 27 y.o. SWF G2P0 smoker pregnant female being seen today for an STI screening visit. The patient reports they do not have symptoms.  Patient reports that they  are pregnant currently  desire a pregnancy in the next year.   They reported they are not interested in discussing contraception today.    Patient's last menstrual period was 08/10/2021.  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 Asymptomatic. Last sex yesterday with condom; with current partner x 1 year; 1 partner in last 3 mo. [redacted] wks pregnant receiving care at [facility]. Current smoker. Last MJ yesterday. Last ETOH 10/2021 (10 shots liquor) and states used to be an alcoholic. LMP 09/06/21. Last pap 12/10/21 neg.   Does the patient using douching products? No  Last HIV test per patient/review of record was No results found for: "HMHIVSCREEN" No results found for: "HIV" Patient reports last pap was No results found for: "DIAGPAP" last pap 12/10/21 neg  Screening for MPX risk: Does the patient have an unexplained rash? No Is the patient MSM? No Does the patient endorse multiple sex partners or anonymous sex partners? No Did the patient have close or sexual contact with a person diagnosed with MPX? No Has the patient traveled outside the [place] where MPX is endemic? No Is there a high clinical suspicion for MPX-- evidenced by one of the following No  -Unlikely to  be chickenpox  -Lymphadenopathy  -Rash that present in same phase of evolution on any given body part See flowsheet for further details and programmatic requirements.   Immunization history:   There is no immunization history on file for this patient.   The following portions of the patient's history were reviewed and updated as appropriate: allergies, current medications, past medical history, past social history, past surgical history and problem list.  Objective:  There were no vitals filed for this visit.  Physical Exam Vitals and nursing note reviewed.  Constitutional:      Appearance: Normal appearance. She is obese.  HENT:     Head: Normocephalic and atraumatic.     Mouth/Throat:     Mouth: Mucous membranes are moist.     Pharynx: Oropharynx is clear. No oropharyngeal exudate or posterior oropharyngeal erythema.  Eyes:     Conjunctiva/sclera: Conjunctivae normal.  Pulmonary:     Effort: Pulmonary effort is normal.  Abdominal:     Palpations: Abdomen is soft. There is no mass.     Tenderness: There is no abdominal tenderness. There is no rebound.     Comments: Increased adipose, poor tone, soft without masses or tenderness, pregnant  Genitourinary:    General: Normal vulva.     Exam position: Lithotomy position.     Pubic Area: No rash or pubic lice.      Labia:        Right: No rash or lesion.        Left: No  rash or lesion.      Vagina: Vaginal discharge (clear leukorrhea, ph<4.5) present. No erythema, bleeding or lesions.     Cervix: Normal.     Uterus: Normal.      Rectum: Normal.     Comments: pH = <4.5 Lymphadenopathy:     Head:     Right side of head: No preauricular or posterior auricular adenopathy.     Left side of head: No preauricular or posterior auricular adenopathy.     Cervical: No cervical adenopathy.     Upper Body:     Right upper body: No supraclavicular, axillary or epitrochlear adenopathy.     Left upper body: No supraclavicular, axillary  or epitrochlear adenopathy.     Lower Body: No right inguinal adenopathy. No left inguinal adenopathy.  Skin:    General: Skin is warm and dry.     Findings: No rash.  Neurological:     Mental Status: She is alert and oriented to person, place, and time.      Assessment and Plan:  Madeline Medina is a 27 y.o. female presenting to the [facility] Department for STI screening  1. Screening examination for venereal disease Treat wet mount per standing orders Immunization nurse consult  - Chlamydia/Gonorrhea [place] [facility] - HIV [place] [facility] - Syphilis Serology, [place] [facility] - WET PREP FOR [place], YEAST, CLUE - Gonococcus culture  2. Trichomonas contact 03/01/22 Needs tx as contact for Trich please  3. Marijuana use Counseled not to use during pregnancy  4. Alcoholic cirrhosis of liver without ascites ([place]) Last use 10/2021; counseled not to drink in pregnancy  5. Smoker Counseled via 5 A's to stop   Patient accepted all screenings including oral, vaginal CT/GC and bloodwork for HIV/RPR, and wet prep. Patient meets criteria for HepB screening? Yes. Ordered? no Patient meets criteria for HepC screening? Yes. Ordered? no  Treat wet prep per standing order Discussed time line for State Lab results and that patient will be called with positive results and encouraged patient to call if she had not heard in 2 weeks.  Counseled to return or seek care for continued or worsening symptoms Recommended condom use with all sex  Patient is currently using  nothing  to prevent pregnancy.    No follow-ups on file.  No future appointments.  Herbie Saxon, CNM

## 2022-03-06 LAB — GONOCOCCUS CULTURE

## 2022-03-11 DIAGNOSIS — Z23 Encounter for immunization: Secondary | ICD-10-CM | POA: Diagnosis not present

## 2022-03-11 DIAGNOSIS — R8271 Bacteriuria: Secondary | ICD-10-CM | POA: Diagnosis not present

## 2022-03-14 DIAGNOSIS — N898 Other specified noninflammatory disorders of vagina: Secondary | ICD-10-CM | POA: Diagnosis not present

## 2022-03-14 DIAGNOSIS — O26892 Other specified pregnancy related conditions, second trimester: Secondary | ICD-10-CM | POA: Diagnosis not present

## 2022-03-14 DIAGNOSIS — O0991 Supervision of high risk pregnancy, unspecified, first trimester: Secondary | ICD-10-CM | POA: Diagnosis not present

## 2022-03-20 DIAGNOSIS — I519 Heart disease, unspecified: Secondary | ICD-10-CM | POA: Diagnosis not present

## 2022-03-20 DIAGNOSIS — R0602 Shortness of breath: Secondary | ICD-10-CM | POA: Diagnosis not present

## 2022-03-20 DIAGNOSIS — I517 Cardiomegaly: Secondary | ICD-10-CM | POA: Diagnosis not present

## 2022-03-20 DIAGNOSIS — R002 Palpitations: Secondary | ICD-10-CM | POA: Diagnosis not present

## 2022-03-20 DIAGNOSIS — Q263 Partial anomalous pulmonary venous connection: Secondary | ICD-10-CM | POA: Diagnosis not present

## 2022-03-20 DIAGNOSIS — Z8679 Personal history of other diseases of the circulatory system: Secondary | ICD-10-CM | POA: Diagnosis not present

## 2022-04-03 DIAGNOSIS — I517 Cardiomegaly: Secondary | ICD-10-CM | POA: Diagnosis not present

## 2022-04-13 ENCOUNTER — Observation Stay
Admission: EM | Admit: 2022-04-13 | Discharge: 2022-04-13 | Disposition: A | Discharge status: Home / Self Care | Payer: Medicaid Other | Attending: Obstetrics and Gynecology | Admitting: Obstetrics and Gynecology

## 2022-04-13 ENCOUNTER — Observation Stay: Payer: Medicaid Other

## 2022-04-13 ENCOUNTER — Encounter: Payer: Self-pay | Admitting: Obstetrics and Gynecology

## 2022-04-13 DIAGNOSIS — F17210 Nicotine dependence, cigarettes, uncomplicated: Secondary | ICD-10-CM | POA: Insufficient documentation

## 2022-04-13 DIAGNOSIS — O4693 Antepartum hemorrhage, unspecified, third trimester: Secondary | ICD-10-CM | POA: Diagnosis not present

## 2022-04-13 DIAGNOSIS — O468X3 Other antepartum hemorrhage, third trimester: Secondary | ICD-10-CM | POA: Diagnosis not present

## 2022-04-13 DIAGNOSIS — O4690 Antepartum hemorrhage, unspecified, unspecified trimester: Secondary | ICD-10-CM | POA: Diagnosis present

## 2022-04-13 DIAGNOSIS — O99333 Smoking (tobacco) complicating pregnancy, third trimester: Secondary | ICD-10-CM | POA: Diagnosis not present

## 2022-04-13 DIAGNOSIS — Z3A32 32 weeks gestation of pregnancy: Secondary | ICD-10-CM | POA: Diagnosis not present

## 2022-04-13 DIAGNOSIS — Z3A31 31 weeks gestation of pregnancy: Secondary | ICD-10-CM | POA: Diagnosis not present

## 2022-04-13 DIAGNOSIS — Z79899 Other long term (current) drug therapy: Secondary | ICD-10-CM | POA: Diagnosis not present

## 2022-04-13 LAB — CHLAMYDIA/NGC RT PCR (ARMC ONLY)
Chlamydia Tr: NOT DETECTED
N gonorrhoeae: NOT DETECTED

## 2022-04-13 LAB — WET PREP, GENITAL
Clue Cells Wet Prep HPF POC: NONE SEEN
Trich, Wet Prep: NONE SEEN
WBC, Wet Prep HPF POC: <10 (ref <10)
Yeast Wet Prep HPF POC: NONE SEEN

## 2022-04-13 MED ORDER — ACETAMINOPHEN 325 MG PO TABS: 650.0000 mg | ORAL_TABLET | ORAL | Status: DC | PRN

## 2022-04-13 MED ORDER — CALCIUM CARBONATE ANTACID 500 MG PO CHEW: 2.0000 | CHEWABLE_TABLET | ORAL | Status: DC | PRN

## 2022-04-13 MED ORDER — PRENATAL MULTIVITAMIN CH: 1.0000 | ORAL_TABLET | Freq: Every day | ORAL | Status: DC

## 2022-04-13 MED ORDER — ZOLPIDEM TARTRATE 5 MG PO TABS: 5.0000 mg | ORAL_TABLET | Freq: Every evening | ORAL | Status: DC | PRN

## 2022-04-13 MED ORDER — DOCUSATE SODIUM 100 MG PO CAPS: 100.0000 mg | ORAL_CAPSULE | Freq: Every day | ORAL | Status: DC

## 2022-04-13 NOTE — Discharge Summary (Cosign Needed Addendum)
Madeline Medina is a 27 y.o. female. She is at 43w2dgestation. Patient's last menstrual period was 09/06/2021 (exact date). 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 Repeat echo improved from baseline. Symptoms resolved with lasix. - Currently taking 20-'40mg'$  lasix every few days PRN when develops symptoms; encouraged consistent daily use to maintain euvolemia 2. Obesity affecting pregnancy in first trimester - Prepregnancy BMI 45, TWG 3.4kg - Growth [place] - Plan ANT starting at 34w 3. History of HSV - continues to be asymptomatic - Suppressive therapy to start at 36w 4.Tobacco use disorder - Continues to work on weaning, encouraged smoking cessation 5.Trichimoniasis -s/p treatment with neg TOC, no symptoms today, plan repeat test at 36w  S: Resting comfortably. no CTX,no LOF,  Active fetal movement.   Maternal Medical History:  Past Medical Hx:  has a past medical history of Dextrocardia and Heart murmur.    Past Surgical Hx:  has a past surgical history that includes Ectopic pregnancy surgery; Unilateral salpingectomy (Right); and Ectopic pregnancy surgery.   Allergies  Allergen Reactions   Hydrocodone Hives   Penicillins Hives     Prior to Admission medications   Medication Sig Start Date End Date Taking? Authorizing Provider  acyclovir (ZOVIRAX) 400 MG tablet Take 1 tablet (400 mg total) by mouth 2 (two) times daily. Patient not taking: Reported on 03/01/2022 10/19/20   HJerene Dilling PA  acyclovir (ZOVIRAX) 800 MG tablet Take 1 tablet (800 mg total) by mouth daily. Patient not taking: Reported on 03/01/2022 10/19/20   HJerene Dilling PA  fluticasone  ([facility] 50 MCG/ACT nasal spray Place 2 sprays into both nostrils daily. Patient not taking: Reported on 03/01/2022 01/17/21   MMelynda Ripple MD  furosemide (LASIX) 40 MG tablet Take 1 tablet (40 mg total) by mouth daily. Patient not taking: Reported on 03/01/2022 09/05/21 03/04/22  Menshew, JDannielle Karvonen PA-C  ibuprofen (ADVIL) 600 MG tablet Take 1 tablet (600 mg total) by mouth every 6 (six) hours as needed. Patient not taking: Reported on 03/01/2022 01/17/21   MMelynda Ripple MD  levocetirizine (XYZAL) 5 MG tablet Take 1 tablet (5 mg total) by mouth every evening. Patient not taking: Reported on 03/01/2022 03/24/21   HScot Jun NP  metroNIDAZOLE (FLAGYL) 500 MG tablet Take 1 tablet (500 mg total) by mouth 2 (two) times daily. 03/01/22   Sciora, EReal Cons CNM    Social History: She  reports that she has been smoking cigarettes. She has been smoking an average of .5 packs per day. She has never used smokeless tobacco. She reports current alcohol use of about 10.0 standard drinks of alcohol per week. She reports current drug use. Drug: Marijuana.  Family History: family history is not on file. ,no history of gyn cancers  Review of Systems: A full review of systems was performed and negative except as noted in the HPI.    O:  LMP 09/06/2021 (Exact Date)  No results found for this or any previous visit (from the past 445hour(s)).   Constitutional: NAD, AAOx3  HE/ENT: extraocular movements grossly intact, moist mucous membranes CV: RRR PULM: nl respiratory effort, CTABL Abd: gravid, non-tender, non-distended, soft  Ext: Non-tender, Nonedmeatous Psych: mood appropriate, speech normal Pelvic :  deferred, vaginal bleeding  SVE:     Fetal Monitor: Baseline: 145 bpm Variability: moderate Accels: Present Decels: none Toco: uterine irritability  Category: I  Number of Fetuses: 1   Heart Rate:  131 bpm   Movement: Yes   Presentation: Cephalic   Placental Location:  Lateral Left   Previa: No   Amniotic Fluid (Subjective):  Within normal limits.   AFI: 7.4 cm   BPD: 8.0 cm 32 w  1 d   MATERNAL FINDINGS:   Cervix:  Obscured by the fetal calvarium   Uterus/Adnexae: No abnormality visualized.   IMPRESSION: Limited examination with obscuration of the cervix with transabdominal technique. Single living intrauterine gestation with an estimated gestational age of [redacted] weeks, 1 day. No acute abnormality.   Assessment: 27 y.o. 68w2dhere for antenatal surveillance during pregnancy.  Principle diagnosis: vaginal bleeding in third trimester There were no encounter diagnoses.   Plan: Labor: not present.  Fetal Wellbeing: Reassuring Cat 1 tracing. Reactive NST  Wet prep + sperm GC/Chlam- neg Limited OB ultrasound-reassuring Pelvic rest with vaginal bleeding F/u in 2-3 days for vaginal bleeding D/c home stable, precautions reviewed, follow-up as scheduled.   ----- FAvelino Leeds CNM Certified Nurse Midwife [facility]

## 2022-04-13 NOTE — OB Triage Note (Incomplete)
Pt arrived to unit

## 2022-04-15 ENCOUNTER — Ambulatory Visit
Admission: RE | Admit: 2022-04-15 | Discharge: 2022-04-15 | Disposition: A | Discharge status: Home / Self Care | Payer: Medicaid Other | Source: Ambulatory Visit

## 2022-04-15 VITALS — BP 120/76 | HR 83 | Temp 97.8°F | Resp 18

## 2022-04-15 DIAGNOSIS — R6889 Other general symptoms and signs: Secondary | ICD-10-CM
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
## 2022-04-15 NOTE — Discharge Instructions (Addendum)
Contact your OB provider for a list of medications safe during pregnancy.

## 2022-04-15 NOTE — ED Triage Notes (Signed)
Patient presents to UC for nasal congestion, cough, and body aches x 2 days. Treating symptoms with mucinex.

## 2022-04-25 DIAGNOSIS — Z362 Encounter for other antenatal screening follow-up: Secondary | ICD-10-CM | POA: Diagnosis not present

## 2022-04-25 DIAGNOSIS — O0991 Supervision of high risk pregnancy, unspecified, first trimester: Secondary | ICD-10-CM | POA: Diagnosis not present

## 2022-04-25 DIAGNOSIS — O352XX0 Maternal care for (suspected) hereditary disease in fetus, not applicable or unspecified: Secondary | ICD-10-CM | POA: Diagnosis not present

## 2022-04-25 DIAGNOSIS — O0990 Supervision of high risk pregnancy, unspecified, unspecified trimester: Secondary | ICD-10-CM | POA: Diagnosis not present

## 2022-04-25 DIAGNOSIS — K8020 Calculus of gallbladder without cholecystitis without obstruction: Secondary | ICD-10-CM | POA: Diagnosis not present

## 2022-04-25 DIAGNOSIS — R8271 Bacteriuria: Secondary | ICD-10-CM | POA: Diagnosis not present

## 2022-04-25 DIAGNOSIS — Z8679 Personal history of other diseases of the circulatory system: Secondary | ICD-10-CM | POA: Diagnosis not present

## 2022-04-25 DIAGNOSIS — F17200 Nicotine dependence, unspecified, uncomplicated: Secondary | ICD-10-CM | POA: Diagnosis not present

## 2022-04-25 DIAGNOSIS — Z3A33 33 weeks gestation of pregnancy: Secondary | ICD-10-CM | POA: Diagnosis not present

## 2022-04-26 DIAGNOSIS — I492 Junctional premature depolarization: Secondary | ICD-10-CM | POA: Diagnosis not present

## 2022-04-26 DIAGNOSIS — R008 Other abnormalities of heart beat: Secondary | ICD-10-CM | POA: Diagnosis not present

## 2022-04-26 DIAGNOSIS — I472 Ventricular tachycardia, unspecified: Secondary | ICD-10-CM | POA: Diagnosis not present

## 2022-05-01 DIAGNOSIS — Z79899 Other long term (current) drug therapy: Secondary | ICD-10-CM | POA: Diagnosis not present

## 2022-05-01 DIAGNOSIS — Z8679 Personal history of other diseases of the circulatory system: Secondary | ICD-10-CM | POA: Diagnosis not present

## 2022-05-01 DIAGNOSIS — Z8619 Personal history of other infectious and parasitic diseases: Secondary | ICD-10-CM | POA: Diagnosis not present

## 2022-05-09 DIAGNOSIS — Z8679 Personal history of other diseases of the circulatory system: Secondary | ICD-10-CM | POA: Diagnosis not present

## 2022-05-09 DIAGNOSIS — Z1389 Encounter for screening for other disorder: Secondary | ICD-10-CM | POA: Diagnosis not present

## 2022-05-14 DIAGNOSIS — I517 Cardiomegaly: Secondary | ICD-10-CM | POA: Diagnosis not present

## 2022-05-14 DIAGNOSIS — O0993 Supervision of high risk pregnancy, unspecified, third trimester: Secondary | ICD-10-CM | POA: Diagnosis not present

## 2022-05-14 DIAGNOSIS — I519 Heart disease, unspecified: Secondary | ICD-10-CM | POA: Diagnosis not present

## 2022-05-14 DIAGNOSIS — Z8679 Personal history of other diseases of the circulatory system: Secondary | ICD-10-CM | POA: Diagnosis not present

## 2022-05-22 DIAGNOSIS — O99824 Streptococcus B carrier state complicating childbirth: Secondary | ICD-10-CM | POA: Diagnosis not present

## 2022-05-22 DIAGNOSIS — O99214 Obesity complicating childbirth: Secondary | ICD-10-CM | POA: Diagnosis not present

## 2022-05-22 DIAGNOSIS — R9431 Abnormal electrocardiogram [ECG] [EKG]: Secondary | ICD-10-CM | POA: Diagnosis not present

## 2022-05-22 DIAGNOSIS — Q263 Partial anomalous pulmonary venous connection: Secondary | ICD-10-CM | POA: Diagnosis not present

## 2022-05-22 DIAGNOSIS — Z880 Allergy status to penicillin: Secondary | ICD-10-CM | POA: Diagnosis not present

## 2022-05-22 DIAGNOSIS — F17210 Nicotine dependence, cigarettes, uncomplicated: Secondary | ICD-10-CM | POA: Diagnosis not present

## 2022-05-22 DIAGNOSIS — Z591 Inadequate housing, unspecified: Secondary | ICD-10-CM | POA: Diagnosis not present

## 2022-05-22 DIAGNOSIS — Z3A36 36 weeks gestation of pregnancy: Secondary | ICD-10-CM | POA: Diagnosis not present

## 2022-05-22 DIAGNOSIS — I517 Cardiomegaly: Secondary | ICD-10-CM | POA: Diagnosis not present

## 2022-05-22 DIAGNOSIS — I509 Heart failure, unspecified: Secondary | ICD-10-CM | POA: Diagnosis not present

## 2022-05-22 DIAGNOSIS — Z716 Tobacco abuse counseling: Secondary | ICD-10-CM | POA: Diagnosis not present

## 2022-05-22 DIAGNOSIS — Q249 Congenital malformation of heart, unspecified: Secondary | ICD-10-CM | POA: Diagnosis not present

## 2022-05-22 DIAGNOSIS — O99892 Other specified diseases and conditions complicating childbirth: Secondary | ICD-10-CM | POA: Diagnosis not present

## 2022-05-22 DIAGNOSIS — O352XX0 Maternal care for (suspected) hereditary disease in fetus, not applicable or unspecified: Secondary | ICD-10-CM | POA: Diagnosis not present

## 2022-05-22 DIAGNOSIS — Q240 Dextrocardia: Secondary | ICD-10-CM | POA: Diagnosis not present

## 2022-05-22 DIAGNOSIS — Z3A37 37 weeks gestation of pregnancy: Secondary | ICD-10-CM | POA: Diagnosis not present

## 2022-05-22 DIAGNOSIS — E8779 Other fluid overload: Secondary | ICD-10-CM | POA: Diagnosis not present

## 2022-05-22 DIAGNOSIS — D62 Acute posthemorrhagic anemia: Secondary | ICD-10-CM | POA: Diagnosis not present

## 2022-05-22 DIAGNOSIS — F419 Anxiety disorder, unspecified: Secondary | ICD-10-CM | POA: Diagnosis not present

## 2022-05-22 DIAGNOSIS — O99284 Endocrine, nutritional and metabolic diseases complicating childbirth: Secondary | ICD-10-CM | POA: Diagnosis not present

## 2022-05-22 DIAGNOSIS — O9942 Diseases of the circulatory system complicating childbirth: Secondary | ICD-10-CM | POA: Diagnosis not present

## 2022-05-22 DIAGNOSIS — Z8679 Personal history of other diseases of the circulatory system: Secondary | ICD-10-CM | POA: Diagnosis not present

## 2022-05-22 DIAGNOSIS — E8770 Fluid overload, unspecified: Secondary | ICD-10-CM | POA: Diagnosis not present

## 2022-05-22 DIAGNOSIS — Z91199 Patient's noncompliance with other medical treatment and regimen due to unspecified reason: Secondary | ICD-10-CM | POA: Diagnosis not present

## 2022-05-24 DIAGNOSIS — Q240 Dextrocardia: Secondary | ICD-10-CM | POA: Diagnosis not present

## 2022-05-24 DIAGNOSIS — O99892 Other specified diseases and conditions complicating childbirth: Secondary | ICD-10-CM | POA: Diagnosis not present

## 2022-05-24 DIAGNOSIS — Z3A37 37 weeks gestation of pregnancy: Secondary | ICD-10-CM | POA: Diagnosis not present

## 2022-05-26 DIAGNOSIS — Z8679 Personal history of other diseases of the circulatory system: Secondary | ICD-10-CM | POA: Diagnosis not present

## 2022-05-29 ENCOUNTER — Telehealth: Payer: Self-pay

## 2022-05-29 DIAGNOSIS — I519 Heart disease, unspecified: Secondary | ICD-10-CM | POA: Diagnosis not present

## 2022-05-29 DIAGNOSIS — Q263 Partial anomalous pulmonary venous connection: Secondary | ICD-10-CM | POA: Diagnosis not present

## 2022-05-29 DIAGNOSIS — Z8679 Personal history of other diseases of the circulatory system: Secondary | ICD-10-CM | POA: Diagnosis not present

## 2022-05-29 DIAGNOSIS — I517 Cardiomegaly: Secondary | ICD-10-CM | POA: Diagnosis not present

## 2022-05-29 NOTE — Telephone Encounter (Signed)
PP nurse called to schedule a home visit before patient moves back to [place] 06/01/22. Patient states she is not staying in [place] at the moment. Patient declined a home visit, did say she would come into [facility] today 05/29/22. RN informed her to call back if she changed her mind and follow up with medical care as soon as possible upon returning to [place].

## 2023-02-07 IMAGING — CR DG CHEST 2V
2 series · 2 of 2 positions shown · non-contrast
Comparison: None Available.

CLINICAL DATA: Shortness of breath. Bilateral lower extremity
edema. History of heart murmur and dextrocardia.

EXAM:
CHEST - 2 VIEW

[chest pa]
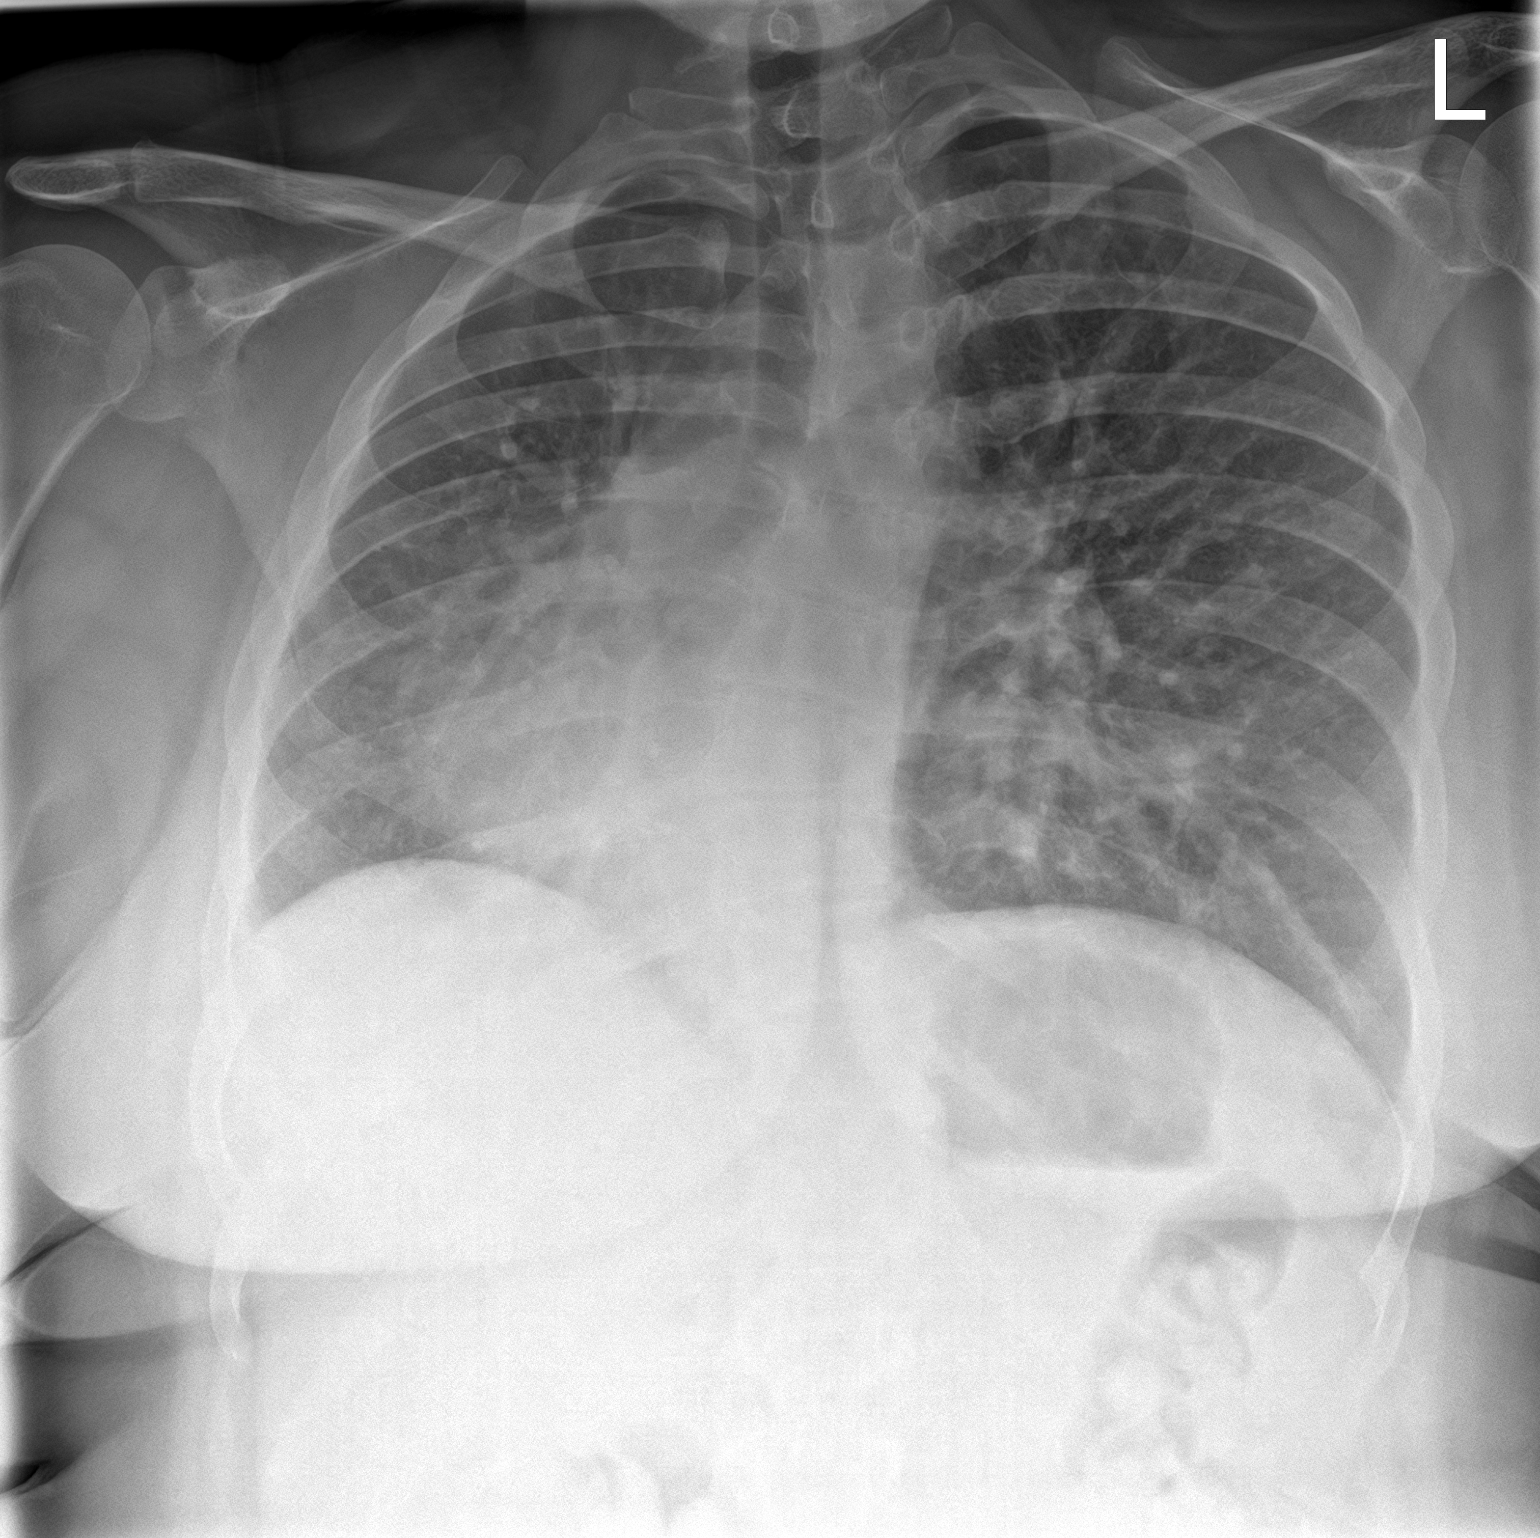

[chest lat]
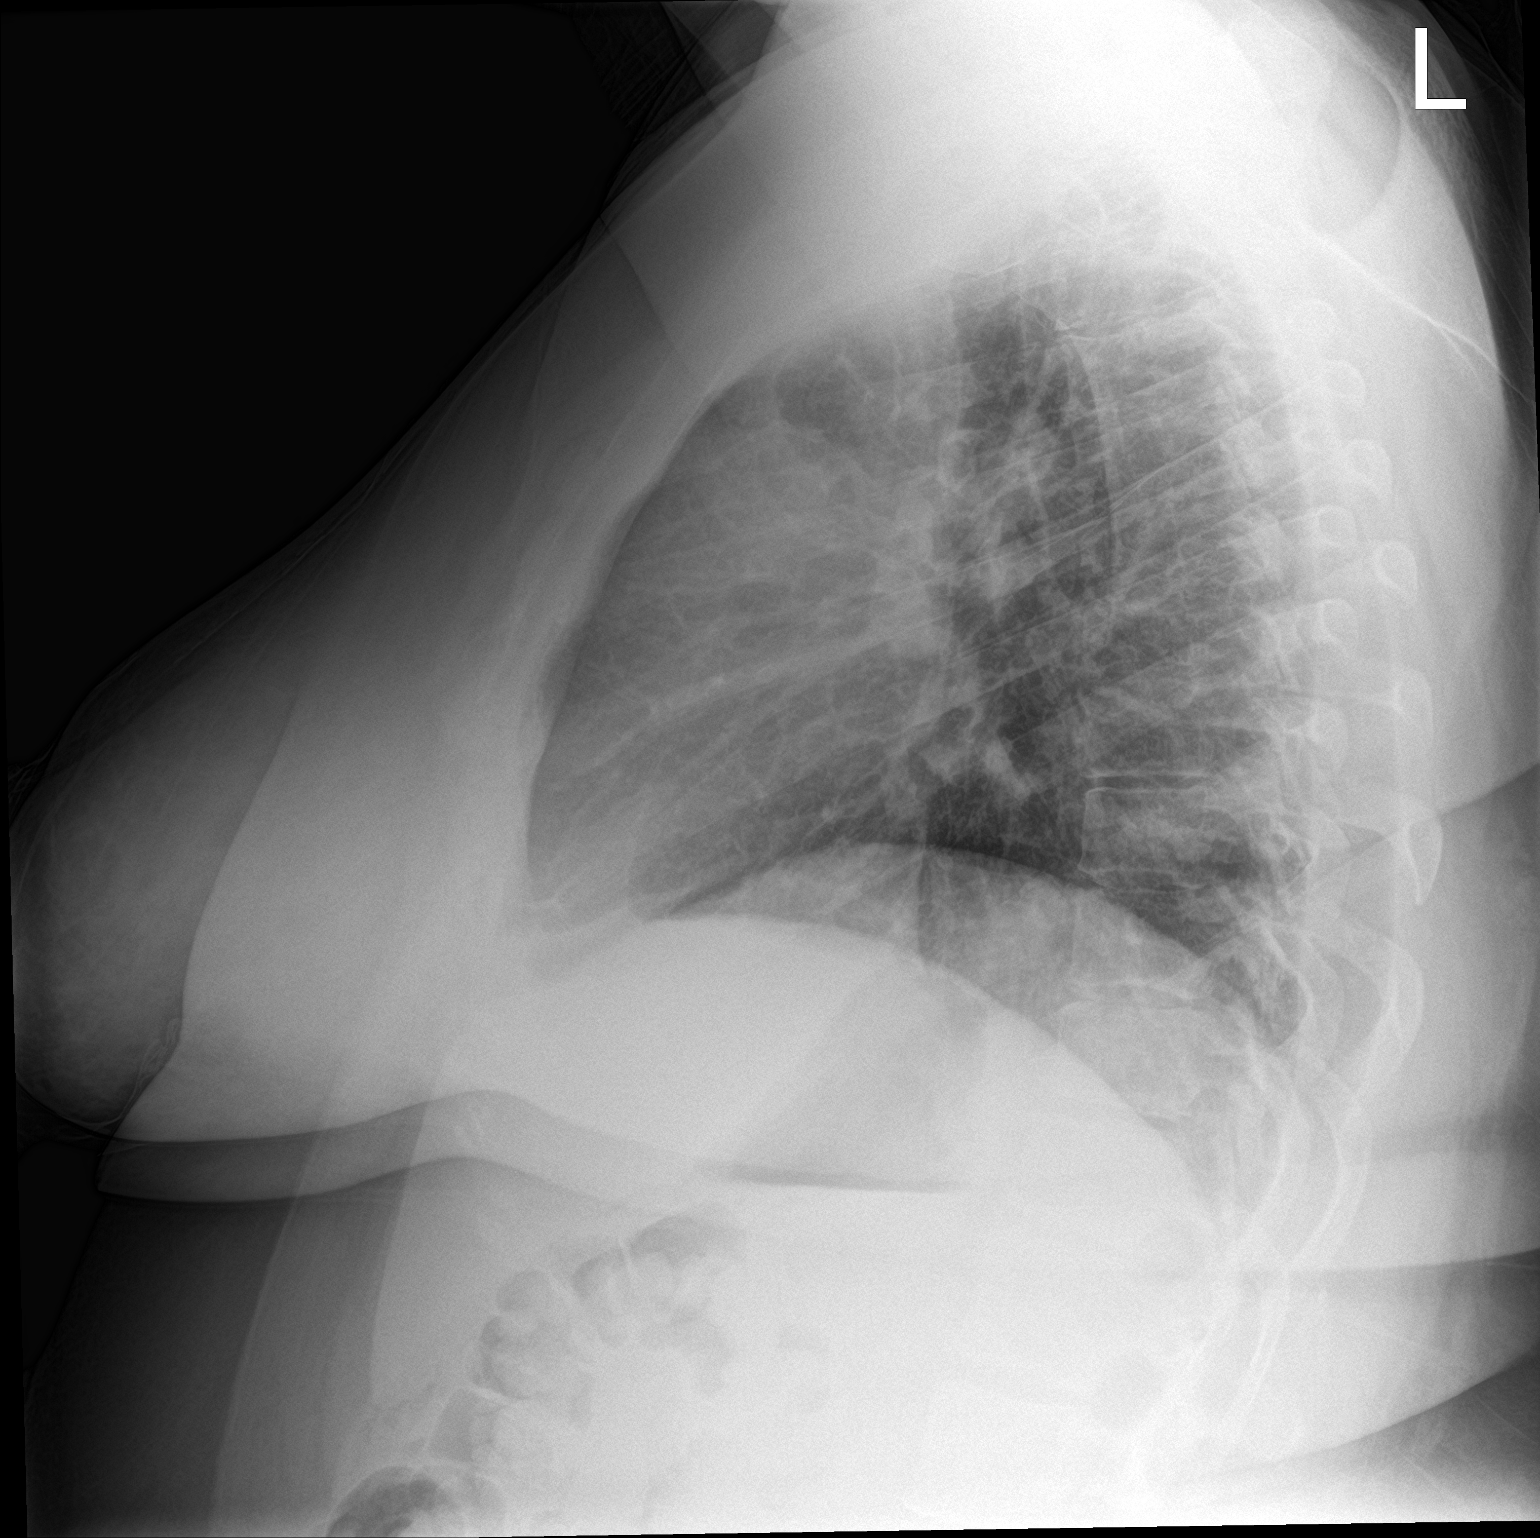

[2 of 2 positions shown; findings below may reference images not displayed]

FINDINGS: The heart is on the right with aortic arch appearing on the left and
stomach bubble on the left. Pulmonary vascular congestion is
suggested. No edema or consolidation. No pleural effusions. No
pneumothorax.
IMPRESSION: Dextrocardia with pulmonary vascular congestion. No focal
consolidation or edema.
